# Patient Record
Sex: Female | Born: 1945 | Race: White | Hispanic: No | Marital: Married | Smoking: Never smoker
Health system: Southern US, Community
[De-identification: ages and names within clinical notes are randomized; demographics above are authoritative.]

## PROBLEM LIST (undated history)

## (undated) DIAGNOSIS — R32 Unspecified urinary incontinence: Secondary | ICD-10-CM

## (undated) DIAGNOSIS — M1990 Unspecified osteoarthritis, unspecified site: Secondary | ICD-10-CM

## (undated) DIAGNOSIS — K219 Gastro-esophageal reflux disease without esophagitis: Secondary | ICD-10-CM

## (undated) DIAGNOSIS — E7800 Pure hypercholesterolemia, unspecified: Secondary | ICD-10-CM

## (undated) DIAGNOSIS — M810 Age-related osteoporosis without current pathological fracture: Secondary | ICD-10-CM

## (undated) HISTORY — PX: CYST EXCISION: SHX5701

## (undated) HISTORY — PX: ABDOMINAL HYSTERECTOMY: SHX81

## (undated) HISTORY — PX: HYSTEROSCOPY: SHX211

## (undated) HISTORY — DX: Age-related osteoporosis without current pathological fracture: M81.0

---

## 1997-07-16 ENCOUNTER — Other Ambulatory Visit: Admission: RE | Admit: 1997-07-16 | Discharge: 1997-07-16 | Payer: Self-pay | Admitting: *Deleted

## 1998-01-07 ENCOUNTER — Ambulatory Visit (HOSPITAL_COMMUNITY): Admission: RE | Admit: 1998-01-07 | Discharge: 1998-01-07 | Payer: Self-pay | Admitting: Obstetrics & Gynecology

## 1998-01-07 ENCOUNTER — Encounter: Payer: Self-pay | Admitting: Obstetrics & Gynecology

## 1999-02-16 ENCOUNTER — Other Ambulatory Visit: Admission: RE | Admit: 1999-02-16 | Discharge: 1999-02-16 | Payer: Self-pay | Admitting: Obstetrics & Gynecology

## 1999-09-15 ENCOUNTER — Ambulatory Visit (HOSPITAL_COMMUNITY): Admission: RE | Admit: 1999-09-15 | Discharge: 1999-09-15 | Payer: Self-pay | Admitting: Obstetrics & Gynecology

## 1999-09-15 ENCOUNTER — Encounter: Payer: Self-pay | Admitting: Obstetrics & Gynecology

## 1999-09-29 ENCOUNTER — Ambulatory Visit (HOSPITAL_COMMUNITY): Admission: RE | Admit: 1999-09-29 | Discharge: 1999-09-29 | Payer: Self-pay | Admitting: Obstetrics & Gynecology

## 2000-04-24 ENCOUNTER — Other Ambulatory Visit: Admission: RE | Admit: 2000-04-24 | Discharge: 2000-04-24 | Payer: Self-pay | Admitting: Obstetrics & Gynecology

## 2001-06-15 ENCOUNTER — Other Ambulatory Visit: Admission: RE | Admit: 2001-06-15 | Discharge: 2001-06-15 | Payer: Self-pay | Admitting: Obstetrics & Gynecology

## 2002-07-18 ENCOUNTER — Other Ambulatory Visit: Admission: RE | Admit: 2002-07-18 | Discharge: 2002-07-18 | Payer: Self-pay | Admitting: Obstetrics and Gynecology

## 2003-08-13 ENCOUNTER — Other Ambulatory Visit: Admission: RE | Admit: 2003-08-13 | Discharge: 2003-08-13 | Payer: Self-pay | Admitting: Obstetrics & Gynecology

## 2014-05-19 DIAGNOSIS — I10 Essential (primary) hypertension: Secondary | ICD-10-CM | POA: Diagnosis not present

## 2014-05-19 DIAGNOSIS — E780 Pure hypercholesterolemia: Secondary | ICD-10-CM | POA: Diagnosis not present

## 2014-05-20 DIAGNOSIS — E780 Pure hypercholesterolemia: Secondary | ICD-10-CM | POA: Diagnosis not present

## 2014-05-29 DIAGNOSIS — E785 Hyperlipidemia, unspecified: Secondary | ICD-10-CM | POA: Diagnosis not present

## 2014-05-29 DIAGNOSIS — M545 Low back pain: Secondary | ICD-10-CM | POA: Diagnosis not present

## 2014-05-29 DIAGNOSIS — D369 Benign neoplasm, unspecified site: Secondary | ICD-10-CM | POA: Diagnosis not present

## 2014-05-29 DIAGNOSIS — M722 Plantar fascial fibromatosis: Secondary | ICD-10-CM | POA: Diagnosis not present

## 2014-06-20 DIAGNOSIS — D2372 Other benign neoplasm of skin of left lower limb, including hip: Secondary | ICD-10-CM | POA: Diagnosis not present

## 2014-06-20 DIAGNOSIS — R898 Other abnormal findings in specimens from other organs, systems and tissues: Secondary | ICD-10-CM | POA: Diagnosis not present

## 2014-06-20 DIAGNOSIS — D489 Neoplasm of uncertain behavior, unspecified: Secondary | ICD-10-CM | POA: Diagnosis not present

## 2014-06-20 DIAGNOSIS — L988 Other specified disorders of the skin and subcutaneous tissue: Secondary | ICD-10-CM | POA: Diagnosis not present

## 2014-06-25 DIAGNOSIS — L821 Other seborrheic keratosis: Secondary | ICD-10-CM | POA: Diagnosis not present

## 2014-07-25 DIAGNOSIS — R3915 Urgency of urination: Secondary | ICD-10-CM | POA: Diagnosis not present

## 2014-07-25 DIAGNOSIS — N905 Atrophy of vulva: Secondary | ICD-10-CM | POA: Diagnosis not present

## 2014-09-15 DIAGNOSIS — H2513 Age-related nuclear cataract, bilateral: Secondary | ICD-10-CM | POA: Diagnosis not present

## 2014-09-15 DIAGNOSIS — H40033 Anatomical narrow angle, bilateral: Secondary | ICD-10-CM | POA: Diagnosis not present

## 2014-09-29 DIAGNOSIS — I10 Essential (primary) hypertension: Secondary | ICD-10-CM | POA: Diagnosis not present

## 2014-09-29 DIAGNOSIS — E785 Hyperlipidemia, unspecified: Secondary | ICD-10-CM | POA: Diagnosis not present

## 2014-09-29 DIAGNOSIS — E780 Pure hypercholesterolemia: Secondary | ICD-10-CM | POA: Diagnosis not present

## 2014-10-06 DIAGNOSIS — E785 Hyperlipidemia, unspecified: Secondary | ICD-10-CM | POA: Diagnosis not present

## 2014-10-06 DIAGNOSIS — M545 Low back pain: Secondary | ICD-10-CM | POA: Diagnosis not present

## 2014-10-06 DIAGNOSIS — M722 Plantar fascial fibromatosis: Secondary | ICD-10-CM | POA: Diagnosis not present

## 2015-03-04 DIAGNOSIS — E785 Hyperlipidemia, unspecified: Secondary | ICD-10-CM | POA: Diagnosis not present

## 2015-03-04 DIAGNOSIS — E7800 Pure hypercholesterolemia, unspecified: Secondary | ICD-10-CM | POA: Diagnosis not present

## 2015-03-04 DIAGNOSIS — I10 Essential (primary) hypertension: Secondary | ICD-10-CM | POA: Diagnosis not present

## 2015-03-11 DIAGNOSIS — Z1389 Encounter for screening for other disorder: Secondary | ICD-10-CM | POA: Diagnosis not present

## 2015-03-11 DIAGNOSIS — M545 Low back pain: Secondary | ICD-10-CM | POA: Diagnosis not present

## 2015-03-11 DIAGNOSIS — E785 Hyperlipidemia, unspecified: Secondary | ICD-10-CM | POA: Diagnosis not present

## 2015-08-04 DIAGNOSIS — E7800 Pure hypercholesterolemia, unspecified: Secondary | ICD-10-CM | POA: Diagnosis not present

## 2015-08-04 DIAGNOSIS — Z13220 Encounter for screening for lipoid disorders: Secondary | ICD-10-CM | POA: Diagnosis not present

## 2015-08-04 DIAGNOSIS — I10 Essential (primary) hypertension: Secondary | ICD-10-CM | POA: Diagnosis not present

## 2015-08-07 DIAGNOSIS — E785 Hyperlipidemia, unspecified: Secondary | ICD-10-CM | POA: Diagnosis not present

## 2015-08-07 DIAGNOSIS — M545 Low back pain: Secondary | ICD-10-CM | POA: Diagnosis not present

## 2015-08-07 DIAGNOSIS — K5900 Constipation, unspecified: Secondary | ICD-10-CM | POA: Diagnosis not present

## 2015-08-13 DIAGNOSIS — Z1231 Encounter for screening mammogram for malignant neoplasm of breast: Secondary | ICD-10-CM | POA: Diagnosis not present

## 2015-08-14 DIAGNOSIS — N10 Acute tubulo-interstitial nephritis: Secondary | ICD-10-CM | POA: Diagnosis not present

## 2015-08-14 DIAGNOSIS — R509 Fever, unspecified: Secondary | ICD-10-CM | POA: Diagnosis not present

## 2015-08-14 DIAGNOSIS — R300 Dysuria: Secondary | ICD-10-CM | POA: Diagnosis not present

## 2015-09-14 DIAGNOSIS — G44219 Episodic tension-type headache, not intractable: Secondary | ICD-10-CM | POA: Diagnosis not present

## 2015-12-02 DIAGNOSIS — N3000 Acute cystitis without hematuria: Secondary | ICD-10-CM | POA: Diagnosis not present

## 2015-12-02 DIAGNOSIS — Z23 Encounter for immunization: Secondary | ICD-10-CM | POA: Diagnosis not present

## 2015-12-02 DIAGNOSIS — R300 Dysuria: Secondary | ICD-10-CM | POA: Diagnosis not present

## 2015-12-13 ENCOUNTER — Emergency Department (HOSPITAL_COMMUNITY): Payer: Medicare Other

## 2015-12-13 ENCOUNTER — Inpatient Hospital Stay (HOSPITAL_COMMUNITY)
Admission: EM | Admit: 2015-12-13 | Discharge: 2015-12-16 | DRG: 470 | Disposition: A | Discharge status: Home Health | Payer: Medicare Other | Attending: Internal Medicine | Admitting: Internal Medicine

## 2015-12-13 ENCOUNTER — Encounter (HOSPITAL_COMMUNITY): Payer: Self-pay | Admitting: Emergency Medicine

## 2015-12-13 DIAGNOSIS — S72002A Fracture of unspecified part of neck of left femur, initial encounter for closed fracture: Secondary | ICD-10-CM | POA: Diagnosis not present

## 2015-12-13 DIAGNOSIS — R52 Pain, unspecified: Secondary | ICD-10-CM | POA: Diagnosis not present

## 2015-12-13 DIAGNOSIS — D500 Iron deficiency anemia secondary to blood loss (chronic): Secondary | ICD-10-CM | POA: Diagnosis present

## 2015-12-13 DIAGNOSIS — Z808 Family history of malignant neoplasm of other organs or systems: Secondary | ICD-10-CM | POA: Diagnosis not present

## 2015-12-13 DIAGNOSIS — W19XXXA Unspecified fall, initial encounter: Secondary | ICD-10-CM | POA: Diagnosis not present

## 2015-12-13 DIAGNOSIS — W109XXA Fall (on) (from) unspecified stairs and steps, initial encounter: Secondary | ICD-10-CM | POA: Diagnosis present

## 2015-12-13 DIAGNOSIS — M25552 Pain in left hip: Secondary | ICD-10-CM | POA: Diagnosis not present

## 2015-12-13 DIAGNOSIS — D700 Congenital agranulocytosis: Secondary | ICD-10-CM | POA: Diagnosis not present

## 2015-12-13 DIAGNOSIS — Z801 Family history of malignant neoplasm of trachea, bronchus and lung: Secondary | ICD-10-CM | POA: Diagnosis not present

## 2015-12-13 DIAGNOSIS — R2681 Unsteadiness on feet: Secondary | ICD-10-CM | POA: Diagnosis not present

## 2015-12-13 DIAGNOSIS — S72012A Unspecified intracapsular fracture of left femur, initial encounter for closed fracture: Principal | ICD-10-CM | POA: Diagnosis present

## 2015-12-13 DIAGNOSIS — Z7982 Long term (current) use of aspirin: Secondary | ICD-10-CM | POA: Diagnosis not present

## 2015-12-13 DIAGNOSIS — D72829 Elevated white blood cell count, unspecified: Secondary | ICD-10-CM | POA: Diagnosis present

## 2015-12-13 DIAGNOSIS — E7800 Pure hypercholesterolemia, unspecified: Secondary | ICD-10-CM | POA: Diagnosis present

## 2015-12-13 DIAGNOSIS — M79605 Pain in left leg: Secondary | ICD-10-CM | POA: Diagnosis present

## 2015-12-13 DIAGNOSIS — S299XXA Unspecified injury of thorax, initial encounter: Secondary | ICD-10-CM | POA: Diagnosis not present

## 2015-12-13 DIAGNOSIS — S72092A Other fracture of head and neck of left femur, initial encounter for closed fracture: Secondary | ICD-10-CM | POA: Diagnosis not present

## 2015-12-13 DIAGNOSIS — Y9289 Other specified places as the place of occurrence of the external cause: Secondary | ICD-10-CM | POA: Diagnosis not present

## 2015-12-13 DIAGNOSIS — E785 Hyperlipidemia, unspecified: Secondary | ICD-10-CM | POA: Diagnosis present

## 2015-12-13 DIAGNOSIS — Z825 Family history of asthma and other chronic lower respiratory diseases: Secondary | ICD-10-CM | POA: Diagnosis not present

## 2015-12-13 DIAGNOSIS — D72819 Decreased white blood cell count, unspecified: Secondary | ICD-10-CM | POA: Diagnosis not present

## 2015-12-13 HISTORY — DX: Pure hypercholesterolemia, unspecified: E78.00

## 2015-12-13 LAB — BASIC METABOLIC PANEL
Anion gap: 10 (ref 5–15)
BUN: 14 mg/dL (ref 6–20)
CHLORIDE: 103 mmol/L (ref 101–111)
CO2: 23 mmol/L (ref 22–32)
Calcium: 9.4 mg/dL (ref 8.9–10.3)
Creatinine, Ser: 0.73 mg/dL (ref 0.44–1.00)
GFR calc non Af Amer: >60 mL/min (ref >60)
Glucose, Bld: 108 mg/dL — ABNORMAL HIGH (ref 65–99)
POTASSIUM: 3.8 mmol/L (ref 3.5–5.1)
SODIUM: 136 mmol/L (ref 135–145)

## 2015-12-13 LAB — CBC WITH DIFFERENTIAL/PLATELET
Basophils Absolute: 0 10*3/uL (ref 0.0–0.1)
Basophils Relative: 0 %
EOS ABS: 0 10*3/uL (ref 0.0–0.7)
Eosinophils Relative: 0 %
HCT: 39.2 % (ref 36.0–46.0)
HEMOGLOBIN: 12.7 g/dL (ref 12.0–15.0)
LYMPHS ABS: 1.4 10*3/uL (ref 0.7–4.0)
Lymphocytes Relative: 8 %
MCH: 29.3 pg (ref 26.0–34.0)
MCHC: 32.4 g/dL (ref 30.0–36.0)
MCV: 90.5 fL (ref 78.0–100.0)
Monocytes Absolute: 0.8 10*3/uL (ref 0.1–1.0)
Monocytes Relative: 5 %
NEUTROS ABS: 14.9 10*3/uL — AB (ref 1.7–7.7)
NEUTROS PCT: 87 %
Platelets: 268 10*3/uL (ref 150–400)
RBC: 4.33 MIL/uL (ref 3.87–5.11)
RDW: 13.2 % (ref 11.5–15.5)
WBC: 17.1 10*3/uL — AB (ref 4.0–10.5)

## 2015-12-13 LAB — PROTIME-INR
INR: 1.02
PROTHROMBIN TIME: 13.4 s (ref 11.4–15.2)

## 2015-12-13 MED ORDER — ALPRAZOLAM 0.25 MG PO TABS
0.2500 mg | ORAL_TABLET | Freq: Two times a day (BID) | ORAL | Status: DC | PRN
  Administered 2015-12-14 – 2015-12-16 (×2): 0.25 mg via ORAL
  Filled 2015-12-13 (×2): qty 1

## 2015-12-13 MED ORDER — METHOCARBAMOL 1000 MG/10ML IJ SOLN
500.0000 mg | Freq: Three times a day (TID) | INTRAVENOUS | Status: DC | PRN
  Administered 2015-12-14: 500 mg via INTRAVENOUS
  Filled 2015-12-13 (×4): qty 5

## 2015-12-13 MED ORDER — HYDROMORPHONE HCL 1 MG/ML IJ SOLN: 1.0000 mg | INTRAMUSCULAR | Status: DC | PRN

## 2015-12-13 MED ORDER — CO Q 10 100 MG PO CAPS: 100.0000 mg | ORAL_CAPSULE | Freq: Every day | ORAL | Status: DC

## 2015-12-13 MED ORDER — SODIUM CHLORIDE 0.9 % IV SOLN: INTRAVENOUS | Status: DC

## 2015-12-13 MED ORDER — HYDROMORPHONE HCL 1 MG/ML IJ SOLN: 1.0000 mg | Freq: Once | INTRAMUSCULAR | Status: DC

## 2015-12-13 MED ORDER — ASPIRIN EC 325 MG PO TBEC: 325.0000 mg | DELAYED_RELEASE_TABLET | Freq: Every day | ORAL | Status: DC | PRN

## 2015-12-13 MED ORDER — MORPHINE SULFATE (PF) 2 MG/ML IV SOLN
2.0000 mg | INTRAVENOUS | Status: DC | PRN
  Administered 2015-12-13 – 2015-12-14 (×4): 2 mg via INTRAVENOUS
  Filled 2015-12-13 (×4): qty 1

## 2015-12-13 MED ORDER — HYDROMORPHONE HCL 1 MG/ML IJ SOLN
0.5000 mg | INTRAMUSCULAR | Status: DC | PRN
Start: 2015-12-13 — End: 2015-12-13

## 2015-12-13 MED ORDER — ADULT MULTIVITAMIN W/MINERALS CH
1.0000 | ORAL_TABLET | Freq: Every day | ORAL | Status: DC
  Administered 2015-12-15 – 2015-12-16 (×2): 1 via ORAL
  Filled 2015-12-13 (×2): qty 1

## 2015-12-13 MED ORDER — RED YEAST RICE 600 MG PO CAPS: 1200.0000 mg | ORAL_CAPSULE | Freq: Every day | ORAL | Status: DC

## 2015-12-13 MED ORDER — SENNOSIDES-DOCUSATE SODIUM 8.6-50 MG PO TABS
1.0000 | ORAL_TABLET | Freq: Every evening | ORAL | Status: DC | PRN
  Filled 2015-12-13: qty 1

## 2015-12-13 MED ORDER — ONDANSETRON HCL 4 MG/2ML IJ SOLN
4.0000 mg | Freq: Three times a day (TID) | INTRAMUSCULAR | Status: DC | PRN
Start: 2015-12-13 — End: 2015-12-14

## 2015-12-13 MED ORDER — HYDROMORPHONE HCL 1 MG/ML IJ SOLN
1.0000 mg | Freq: Once | INTRAMUSCULAR | Status: AC
  Administered 2015-12-13: 1 mg via INTRAVENOUS
  Filled 2015-12-13: qty 1

## 2015-12-13 MED ORDER — SODIUM CHLORIDE 0.9 % IV SOLN
INTRAVENOUS | Status: DC
  Administered 2015-12-13 – 2015-12-14 (×2): via INTRAVENOUS
  Administered 2015-12-15: 75 mL/h via INTRAVENOUS
  Administered 2015-12-15: 75 mL via INTRAVENOUS

## 2015-12-13 MED ORDER — HEPARIN SODIUM (PORCINE) 5000 UNIT/ML IJ SOLN
5000.0000 [IU] | Freq: Once | INTRAMUSCULAR | Status: AC
  Administered 2015-12-14: 5000 [IU] via SUBCUTANEOUS
  Filled 2015-12-13: qty 1

## 2015-12-13 MED ORDER — OXYCODONE-ACETAMINOPHEN 5-325 MG PO TABS
1.0000 | ORAL_TABLET | ORAL | Status: DC | PRN
  Administered 2015-12-14 (×3): 1 via ORAL
  Filled 2015-12-13 (×3): qty 1

## 2015-12-13 MED ORDER — LORAZEPAM 2 MG/ML IJ SOLN
1.0000 mg | Freq: Once | INTRAMUSCULAR | Status: AC
  Administered 2015-12-13: 1 mg via INTRAVENOUS
  Filled 2015-12-13: qty 1

## 2015-12-13 MED ORDER — ZOLPIDEM TARTRATE 5 MG PO TABS
5.0000 mg | ORAL_TABLET | Freq: Every evening | ORAL | Status: DC | PRN
Start: 2015-12-13 — End: 2015-12-16

## 2015-12-13 NOTE — H&P (Addendum)
History and Physical    MARYLOUISE DUHR [id number] DOB: 1945-11-06 DOA: 12/13/2015  Referring MD/NP/PA:   PCP: [place]   Patient coming from:  The patient is coming from home.  At baseline, pt is independent for most of ADL.  Chief Complaint: left hip pain after fall  HPI: Julia Blair is a 70 y.o. female with medical history significant of hyperlipidemia, who presents with left hip pain after fall.  Pt states that she tripped her steps on the wet cement, fell and injured her left hip, causing severe pain at about 6:00 PM. She strongly denies any injury to head and neck. No headache or neck pain. Her left hip pain is constant, sharp, 10 out of 10 in severity, radiating to the lateral thigh, aggravated by movement. No leg numbness. Patient did not have prodromal symptoms, such as no dizziness, unilateral weakness, chest pain, palpitation. She did not have LOC. Patient denies fever, chills, nausea, vomiting, abdominal pain, diarrhea, symptoms of UTI.  ED Course: pt was found to have WBC 17.1, INR 1.02, electrolytes renal function okay, temperature normal, no tachycardia, oxygen saturation normal, negative chest x-ray. X-ray of left hip/pelvis showed subcapital fracture through the left femoral neck, with superior displacement of the distal femur. Pt is admitted to [facility] bed as inpatient. Orthopedic surgeon, Dr. Sharol Given was consulted.  Review of Systems:   General: no fevers, chills, no changes in body weight, has fatigue HEENT: no blurry vision, hearing changes or sore throat Respiratory: no dyspnea, coughing, wheezing CV: no chest pain, no palpitations GI: no nausea, vomiting, abdominal pain, diarrhea, constipation GU: no dysuria, burning on urination, increased urinary frequency, hematuria  Ext: no leg edema Neuro: no unilateral weakness, numbness, or tingling, no vision change or hearing loss Skin: no rash, no skin tear. MSK: has left hip pain Heme: No easy  bruising.  Travel history: No recent long distant travel.  Allergy:  Allergies  Allergen Reactions  . Naproxen     Headache    Past Medical History:  Diagnosis Date  . High cholesterol     Past Surgical History:  Procedure Laterality Date  . ABDOMINAL HYSTERECTOMY      Social History:  reports that she has never smoked. She does not have any smokeless tobacco history on file. She reports that she drinks alcohol. She reports that she does not use drugs.  Family History:  Family History  Problem Relation Age of Onset  . Asthma Mother   . Throat cancer Father   . Lung cancer Brother      Prior to Admission medications   Medication Sig Start Date End Date Taking? Authorizing Provider  aspirin 325 MG EC tablet Take 325 mg by mouth daily as needed for pain.   Yes Historical Provider, MD  Coenzyme Q10 (CO Q 10) 100 MG CAPS Take 100 mg by mouth daily.   Yes Historical Provider, MD  Multiple Vitamin (MULTIVITAMIN) tablet Take 1 tablet by mouth daily.   Yes Historical Provider, MD  Red Yeast Rice 600 MG CAPS Take 1,200 mg by mouth daily.   Yes Historical Provider, MD    Physical Exam: Vitals:   12/13/15 1951 12/13/15 1953  BP: 164/99   Pulse: 83   Resp: 23   Temp: 97.6 F (36.4 C)   TempSrc: Oral   SpO2: 99%   Weight:  68 kg (150 lb)  Height:  5\' 3"  (1.6 m)   General: Not in acute distress HEENT:  Eyes: PERRL, EOMI, no scleral icterus.       ENT: No discharge from the ears and nose, no pharynx injection, no tonsillar enlargement.        Neck: No JVD, no bruit, no mass felt. Heme: No neck lymph node enlargement. Cardiac: S1/S2, RRR, No murmurs, No gallops or rubs. Respiratory: No rales, wheezing, rhonchi or rubs. GI: Soft, nondistended, nontender, no rebound pain, no organomegaly, BS present. GU: No hematuria Ext: No pitting leg edema bilaterally. 2+DP/PT pulse bilaterally. Musculoskeletal: No joint deformities, No joint redness or warmth. Has tenderness over  left hip. Skin: No rashes.  Neuro: Alert, oriented X3, cranial nerves II-XII grossly intact, moves all extremities. Psych: Patient is not psychotic, no suicidal or hemocidal ideation.  Labs on Admission: I have personally reviewed following labs and imaging studies  CBC:  Recent Labs Lab 12/13/15 2129  WBC 17.1*  NEUTROABS 14.9*  HGB 12.7  HCT 39.2  MCV 90.5  PLT [id number]   Basic Metabolic Panel:  Recent Labs Lab 12/13/15 2129  NA 136  K 3.8  CL 103  CO2 23  GLUCOSE 108*  BUN 14  CREATININE 0.73  CALCIUM 9.4   GFR: Estimated Creatinine Clearance: 61.4 mL/min (by C-G formula based on SCr of 0.73 mg/dL). Liver Function Tests: No results for input(s): AST, ALT, ALKPHOS, BILITOT, PROT, ALBUMIN in the last 168 hours. No results for input(s): LIPASE, AMYLASE in the last 168 hours. No results for input(s): AMMONIA in the last 168 hours. Coagulation Profile:  Recent Labs Lab 12/13/15 2129  INR 1.02   Cardiac Enzymes: No results for input(s): CKTOTAL, CKMB, CKMBINDEX, TROPONINI in the last 168 hours. BNP (last 3 results) No results for input(s): PROBNP in the last 8760 hours. HbA1C: No results for input(s): HGBA1C in the last 72 hours. CBG: No results for input(s): GLUCAP in the last 168 hours. Lipid Profile: No results for input(s): CHOL, HDL, LDLCALC, TRIG, CHOLHDL, LDLDIRECT in the last 72 hours. Thyroid Function Tests: No results for input(s): TSH, T4TOTAL, FREET4, T3FREE, THYROIDAB in the last 72 hours. Anemia Panel: No results for input(s): VITAMINB12, FOLATE, FERRITIN, TIBC, IRON, RETICCTPCT in the last 72 hours. Urine analysis: No results found for: COLORURINE, APPEARANCEUR, LABSPEC, PHURINE, GLUCOSEU, HGBUR, BILIRUBINUR, KETONESUR, PROTEINUR, UROBILINOGEN, NITRITE, LEUKOCYTESUR Sepsis Labs: @LABRCNTIP (procalcitonin:4,lacticidven:4) )No results found for this or any previous visit (from the past 240 hour(s)).   Radiological Exams on Admission: Dg Chest 1  View  Result Date: 12/13/2015 CLINICAL DATA:  Status post fall, with concern for chest injury. Initial encounter. EXAM: CHEST 1 VIEW COMPARISON:  None. FINDINGS: The lungs are well-aerated and clear. There is no evidence of focal opacification, pleural effusion or pneumothorax. The cardiomediastinal silhouette is mildly enlarged. No acute osseous abnormalities are seen. IMPRESSION: Mild cardiomegaly. Lungs remain grossly clear. No displaced rib fracture seen. Electronically Signed   By: Garald Balding M.D.   On: 12/13/2015 21:27   Dg Hip Unilat W Or Wo Pelvis 2-3 Views Left  Result Date: 12/13/2015 CLINICAL DATA:  Slipped and fell, with left hip pain. Initial encounter. EXAM: DG HIP (WITH OR WITHOUT PELVIS) 2-3V LEFT COMPARISON:  None. FINDINGS: There is a subcapital fracture through the left femoral neck, with superior displacement of the distal femur. The left femoral head remains seated at the acetabulum. The right hip joint is unremarkable in appearance. No significant degenerative change is appreciated. The sacroiliac joints are unremarkable in appearance. The visualized bowel gas pattern is grossly unremarkable in appearance. IMPRESSION: Subcapital fracture through  the left femoral neck, with superior displacement of the distal femur. Electronically Signed   By: Garald Balding M.D.   On: 12/13/2015 21:26     EKG: Not done in ED, will get one.   Assessment/Plan Principal Problem:   Closed left hip fracture, initial encounter ([place]) Active Problems:   HLD (hyperlipidemia)   Fall   Leukocytopenia   Closed left hip fracture ([place]): As evidenced by x-ray. Patient has severe pain now. No neurovascular compromise. Orthopedic surgeon was consulted. Dr. Sharol Given is planning surgery possibly tomorrow afternoon.  - will admit to Med-surg bed - Pain control: morphine prn and percocet - When necessary Zofran for nausea - Robaxin for muscle spasm - type and cross - INR/PTT - DVT PPx: give one dose of  sq heparin tonight plus SCD  Leukocytosis: Likely due to stress-induced demargination. CXR negative. Patient does not have signs of infection. -Follow-up CBC - check UA  HLD: Last LDL was not on record. Patient is not on statin. -Check FLP   DVT ppx: SCD (will give one dose of Sq heparin tonight) Code Status: Full code Family Communication: Yes, patient's sister at bed side Disposition Plan:  Anticipate discharge back to previous home environment Consults called:  Kizzie Ide Admission status: medical floor/obs   Date of Service 12/13/2015    Ivor Costa [facility] Pager [phone]  If 7PM-7AM, please contact night-coverage [email or web address] Password [email or web address] 12/13/2015, 10:28 PM

## 2015-12-13 NOTE — ED Provider Notes (Signed)
[facility] DEPT Provider Note   CSN: [id number] Arrival date & time: 12/13/15  1949     History   Chief Complaint Chief Complaint  Patient presents with  . Hip Injury    HPI Julia Blair is a 70 y.o. female.  HPI Patient presents to the emergency department after a mechanical fall which point she slipped on wet cement.  She states that she landed on her left side.  She presents for severe pain in her left hip.  She denies head injury.  She denies neck pain.  She reports the pain in her left hip is severe in severity.  She denies chest pain or shortness of breath.  Denies abdominal pain.  Reports no numbness or tingling of her left lower extremity.   Past Medical History:  Diagnosis Date  . High cholesterol     There are no active problems to display for this patient.   Past Surgical History:  Procedure Laterality Date  . ABDOMINAL HYSTERECTOMY      OB History    No data available       Home Medications    Prior to Admission medications   Medication Sig Start Date End Date Taking? Authorizing Provider  aspirin 325 MG EC tablet Take 325 mg by mouth daily as needed for pain.   Yes Historical Provider, MD  Coenzyme Q10 (CO Q 10) 100 MG CAPS Take 100 mg by mouth daily.   Yes Historical Provider, MD  Multiple Vitamin (MULTIVITAMIN) tablet Take 1 tablet by mouth daily.   Yes Historical Provider, MD  Red Yeast Rice 600 MG CAPS Take 1,200 mg by mouth daily.   Yes Historical Provider, MD    Family History No family history on file.  Social History Social History  Substance Use Topics  . Smoking status: Not on file  . Smokeless tobacco: Not on file  . Alcohol use Not on file     Allergies   Naproxen   Review of Systems Review of Systems  All other systems reviewed and are negative.    Physical Exam Updated Vital Signs BP 164/99 (BP Location: Right Arm)   Pulse 83   Temp 97.6 F (36.4 C) (Oral)   Resp 23   Ht 5\' 3"  (1.6 m)   Wt 150 lb (68  kg)   SpO2 99%   BMI 26.57 kg/m   Physical Exam  Constitutional: She is oriented to person, place, and time. She appears well-developed and well-nourished. No distress.  Uncomfortable appearing  HENT:  Head: Normocephalic and atraumatic.  Eyes: EOM are normal.  Neck: Normal range of motion.  Cardiovascular: Normal rate and regular rhythm.   Pulmonary/Chest: Effort normal and breath sounds normal.  Abdominal: Soft. She exhibits no distension. There is no tenderness.  Musculoskeletal:  No shortening or external rotation of the left lower extremity but there is pain with range of motion of the left hip  Neurological: She is alert and oriented to person, place, and time.  Skin: Skin is warm and dry.  Psychiatric: She has a normal mood and affect. Judgment normal.  Nursing note and vitals reviewed.    ED Treatments / Results  Labs (all labs ordered are listed, but only abnormal results are displayed) Labs Reviewed  CBC WITH DIFFERENTIAL/PLATELET - Abnormal; Notable for the following:       Result Value   WBC 17.1 (*)    Neutro Abs 14.9 (*)    All other components within normal limits  BASIC  METABOLIC PANEL  PROTIME-INR    EKG  EKG Interpretation None       Radiology Dg Chest 1 View  Result Date: 12/13/2015 CLINICAL DATA:  Status post fall, with concern for chest injury. Initial encounter. EXAM: CHEST 1 VIEW COMPARISON:  None. FINDINGS: The lungs are well-aerated and clear. There is no evidence of focal opacification, pleural effusion or pneumothorax. The cardiomediastinal silhouette is mildly enlarged. No acute osseous abnormalities are seen. IMPRESSION: Mild cardiomegaly. Lungs remain grossly clear. No displaced rib fracture seen. Electronically Signed   By: Garald Balding M.D.   On: 12/13/2015 21:27   Dg Hip Unilat W Or Wo Pelvis 2-3 Views Left  Result Date: 12/13/2015 CLINICAL DATA:  Slipped and fell, with left hip pain. Initial encounter. EXAM: DG HIP (WITH OR  WITHOUT PELVIS) 2-3V LEFT COMPARISON:  None. FINDINGS: There is a subcapital fracture through the left femoral neck, with superior displacement of the distal femur. The left femoral head remains seated at the acetabulum. The right hip joint is unremarkable in appearance. No significant degenerative change is appreciated. The sacroiliac joints are unremarkable in appearance. The visualized bowel gas pattern is grossly unremarkable in appearance. IMPRESSION: Subcapital fracture through the left femoral neck, with superior displacement of the distal femur. Electronically Signed   By: Garald Balding M.D.   On: 12/13/2015 21:26    Procedures Procedures (including critical care time)  Medications Ordered in ED Medications  HYDROmorphone (DILAUDID) injection 0.5 mg (not administered)  HYDROmorphone (DILAUDID) injection 1 mg (1 mg Intravenous Given 12/13/15 2028)     Initial Impression / Assessment and Plan / ED Course  I have reviewed the triage vital signs and the nursing notes.  Pertinent labs & imaging results that were available during my care of the patient were reviewed by me and considered in my medical decision making (see chart for details).  Clinical Course    Left subcapital hip fracture.  Spoke with orthopedic surgeon Dr. Sharol Given who will see the patient in the morning.  Request patient state nothing by mouth after midnight.  Patient will be admitted to the hospitalist service.  No other injury.  Pain controlled  Ortho: Dr Sharol Given  Final Clinical Impressions(s) / ED Diagnoses   Final diagnoses:  Closed left hip fracture, initial encounter [facility])    New Prescriptions New Prescriptions   No medications on file     Jola Schmidt, MD 12/13/15 2147

## 2015-12-13 NOTE — Progress Notes (Signed)
PHARMACIST - PHYSICIAN ORDER COMMUNICATION  CONCERNING: P&T Medication Policy on Herbal Medications  DESCRIPTION:  This patient's order for:  Red yeast rice and CoQ 10  has been noted.  This product(s) is classified as an "herbal" or natural product. Due to a lack of definitive safety studies or FDA approval, nonstandard manufacturing practices, plus the potential risk of unknown drug-drug interactions while on inpatient medications, the Pharmacy and Therapeutics Committee does not permit the use of "herbal" or natural products of this type within [facility].   ACTION TAKEN: The pharmacy department is unable to verify this order at this time and your patient has been informed of this safety policy. Please reevaluate patient's clinical condition at discharge and address if the herbal or natural product(s) should be resumed at that time.  Sherlon Handing, PharmD, BCPS Clinical pharmacist, pager [phone] 12/13/2015 10:18 PM

## 2015-12-13 NOTE — ED Notes (Signed)
Delay in lab dray pt is in [place] and when return,  Nurse will draw labs.

## 2015-12-13 NOTE — ED Notes (Signed)
Pt family at nurses station asking for EDP, explained that he was in room with another pt and he would be in shortly.

## 2015-12-13 NOTE — ED Notes (Signed)
Pt called out stating she needs to use bathroom, pt refusing to use bedpan and states she would rather "hold her urine until she dies"

## 2015-12-13 NOTE — ED Triage Notes (Signed)
Pt in EMS from home, slipped and fell on concrete resulting in L hip injury, possible dislocation. Given 10mg  Morphine en route. PVA intact. A/OX4, VSS

## 2015-12-14 ENCOUNTER — Inpatient Hospital Stay (HOSPITAL_COMMUNITY): Payer: Medicare Other | Admitting: Anesthesiology

## 2015-12-14 ENCOUNTER — Encounter (HOSPITAL_COMMUNITY): Payer: Self-pay | Admitting: Anesthesiology

## 2015-12-14 ENCOUNTER — Encounter (HOSPITAL_COMMUNITY)
Admission: EM | Disposition: A | Discharge status: Home Health | Payer: Self-pay | Source: Home / Self Care | Attending: Family Medicine

## 2015-12-14 DIAGNOSIS — S72012A Unspecified intracapsular fracture of left femur, initial encounter for closed fracture: Secondary | ICD-10-CM

## 2015-12-14 HISTORY — PX: HIP ARTHROPLASTY: SHX981

## 2015-12-14 LAB — TYPE AND SCREEN
ABO/RH(D): B POS
ANTIBODY SCREEN: NEGATIVE

## 2015-12-14 LAB — LIPID PANEL
CHOLESTEROL: 278 mg/dL — AB (ref 0–200)
HDL: 38 mg/dL — AB (ref >40)
LDL Cholesterol: 181 mg/dL — ABNORMAL HIGH (ref 0–99)
TRIGLYCERIDES: 295 mg/dL — AB (ref <150)
Total CHOL/HDL Ratio: 7.3 RATIO
VLDL: 59 mg/dL — ABNORMAL HIGH (ref 0–40)

## 2015-12-14 LAB — ABO/RH: ABO/RH(D): B POS

## 2015-12-14 LAB — URINALYSIS, ROUTINE W REFLEX MICROSCOPIC
Bilirubin Urine: NEGATIVE
GLUCOSE, UA: NEGATIVE mg/dL
Hgb urine dipstick: NEGATIVE
Ketones, ur: 15 mg/dL — AB
LEUKOCYTES UA: NEGATIVE
NITRITE: NEGATIVE
PROTEIN: NEGATIVE mg/dL
Specific Gravity, Urine: 1.015 (ref 1.005–1.030)
pH: 6 (ref 5.0–8.0)

## 2015-12-14 LAB — MRSA PCR SCREENING: MRSA by PCR: NEGATIVE

## 2015-12-14 SURGERY — HEMIARTHROPLASTY, HIP, DIRECT ANTERIOR APPROACH, FOR FRACTURE
Anesthesia: General | Site: Hip | Laterality: Left

## 2015-12-14 MED ORDER — FENTANYL CITRATE (PF) 100 MCG/2ML IJ SOLN
INTRAMUSCULAR | Status: AC
  Filled 2015-12-14: qty 2

## 2015-12-14 MED ORDER — MENTHOL 3 MG MT LOZG: 1.0000 | LOZENGE | OROMUCOSAL | Status: DC | PRN

## 2015-12-14 MED ORDER — ONDANSETRON HCL 4 MG/2ML IJ SOLN: 4.0000 mg | Freq: Once | INTRAMUSCULAR | Status: DC | PRN

## 2015-12-14 MED ORDER — METOCLOPRAMIDE HCL 5 MG PO TABS: 5.0000 mg | ORAL_TABLET | Freq: Three times a day (TID) | ORAL | Status: DC | PRN

## 2015-12-14 MED ORDER — ROCURONIUM BROMIDE 10 MG/ML (PF) SYRINGE
PREFILLED_SYRINGE | INTRAVENOUS | Status: AC
  Filled 2015-12-14: qty 10

## 2015-12-14 MED ORDER — ACETAMINOPHEN 650 MG RE SUPP: 650.0000 mg | Freq: Four times a day (QID) | RECTAL | Status: DC | PRN

## 2015-12-14 MED ORDER — MIDAZOLAM HCL 5 MG/5ML IJ SOLN
INTRAMUSCULAR | Status: DC | PRN
  Administered 2015-12-14: 2 mg via INTRAVENOUS

## 2015-12-14 MED ORDER — CHLORHEXIDINE GLUCONATE 4 % EX LIQD
60.0000 mL | Freq: Once | CUTANEOUS | Status: AC
  Administered 2015-12-14: 4 via TOPICAL
  Filled 2015-12-14: qty 60

## 2015-12-14 MED ORDER — DEXAMETHASONE SODIUM PHOSPHATE 10 MG/ML IJ SOLN
INTRAMUSCULAR | Status: AC
  Filled 2015-12-14: qty 1

## 2015-12-14 MED ORDER — MIDAZOLAM HCL 2 MG/2ML IJ SOLN
INTRAMUSCULAR | Status: AC
  Filled 2015-12-14: qty 2

## 2015-12-14 MED ORDER — METOCLOPRAMIDE HCL 5 MG/ML IJ SOLN: 5.0000 mg | Freq: Three times a day (TID) | INTRAMUSCULAR | Status: DC | PRN

## 2015-12-14 MED ORDER — SUGAMMADEX SODIUM 200 MG/2ML IV SOLN
INTRAVENOUS | Status: AC
  Filled 2015-12-14: qty 2

## 2015-12-14 MED ORDER — FENTANYL CITRATE (PF) 100 MCG/2ML IJ SOLN
INTRAMUSCULAR | Status: AC
  Filled 2015-12-14: qty 4

## 2015-12-14 MED ORDER — CEFAZOLIN SODIUM-DEXTROSE 2-4 GM/100ML-% IV SOLN
2.0000 g | INTRAVENOUS | Status: AC
  Administered 2015-12-14: 2 g via INTRAVENOUS
  Filled 2015-12-14 (×2): qty 100

## 2015-12-14 MED ORDER — ENSURE ENLIVE PO LIQD
237.0000 mL | Freq: Two times a day (BID) | ORAL | Status: DC
  Administered 2015-12-15 – 2015-12-16 (×4): 237 mL via ORAL

## 2015-12-14 MED ORDER — POVIDONE-IODINE 10 % EX SWAB
2.0000 "application " | Freq: Once | CUTANEOUS | Status: AC
  Administered 2015-12-14: 2 via TOPICAL

## 2015-12-14 MED ORDER — ONDANSETRON HCL 4 MG PO TABS: 4.0000 mg | ORAL_TABLET | Freq: Four times a day (QID) | ORAL | Status: DC | PRN

## 2015-12-14 MED ORDER — PHENOL 1.4 % MT LIQD: 1.0000 | OROMUCOSAL | Status: DC | PRN

## 2015-12-14 MED ORDER — LACTATED RINGERS IV SOLN
INTRAVENOUS | Status: DC
  Administered 2015-12-14 (×3): via INTRAVENOUS

## 2015-12-14 MED ORDER — HYDROMORPHONE HCL 1 MG/ML IJ SOLN
INTRAMUSCULAR | Status: AC
  Administered 2015-12-14: 0.5 mg via INTRAVENOUS
  Filled 2015-12-14: qty 1

## 2015-12-14 MED ORDER — ONDANSETRON HCL 4 MG/2ML IJ SOLN
INTRAMUSCULAR | Status: AC
  Filled 2015-12-14: qty 2

## 2015-12-14 MED ORDER — DEXAMETHASONE SODIUM PHOSPHATE 10 MG/ML IJ SOLN
INTRAMUSCULAR | Status: DC | PRN
  Administered 2015-12-14: 10 mg via INTRAVENOUS

## 2015-12-14 MED ORDER — ONDANSETRON HCL 4 MG/2ML IJ SOLN: 4.0000 mg | Freq: Four times a day (QID) | INTRAMUSCULAR | Status: DC | PRN

## 2015-12-14 MED ORDER — ASPIRIN EC 325 MG PO TBEC: 325.0000 mg | DELAYED_RELEASE_TABLET | Freq: Every day | ORAL | 0 refills | Status: DC

## 2015-12-14 MED ORDER — HYDROCODONE-ACETAMINOPHEN 5-325 MG PO TABS
1.0000 | ORAL_TABLET | Freq: Four times a day (QID) | ORAL | Status: DC | PRN
  Administered 2015-12-14 – 2015-12-16 (×7): 2 via ORAL
  Filled 2015-12-14 (×6): qty 2

## 2015-12-14 MED ORDER — ONDANSETRON HCL 4 MG/2ML IJ SOLN
INTRAMUSCULAR | Status: AC
  Filled 2015-12-14: qty 4

## 2015-12-14 MED ORDER — PROPOFOL 10 MG/ML IV BOLUS
INTRAVENOUS | Status: AC
  Filled 2015-12-14: qty 20

## 2015-12-14 MED ORDER — NEOSTIGMINE METHYLSULFATE 5 MG/5ML IV SOSY
PREFILLED_SYRINGE | INTRAVENOUS | Status: AC
  Filled 2015-12-14: qty 5

## 2015-12-14 MED ORDER — ONDANSETRON HCL 4 MG/2ML IJ SOLN
INTRAMUSCULAR | Status: DC | PRN
  Administered 2015-12-14: 4 mg via INTRAVENOUS

## 2015-12-14 MED ORDER — CEFAZOLIN SODIUM-DEXTROSE 2-4 GM/100ML-% IV SOLN
2.0000 g | Freq: Four times a day (QID) | INTRAVENOUS | Status: AC
  Administered 2015-12-15 (×2): 2 g via INTRAVENOUS
  Filled 2015-12-14 (×2): qty 100

## 2015-12-14 MED ORDER — HYDROMORPHONE HCL 1 MG/ML IJ SOLN
0.2500 mg | INTRAMUSCULAR | Status: DC | PRN
  Administered 2015-12-14 (×4): 0.5 mg via INTRAVENOUS

## 2015-12-14 MED ORDER — PROPOFOL 10 MG/ML IV BOLUS
INTRAVENOUS | Status: DC | PRN
  Administered 2015-12-14: 200 mg via INTRAVENOUS

## 2015-12-14 MED ORDER — ROCURONIUM BROMIDE 100 MG/10ML IV SOLN
INTRAVENOUS | Status: DC | PRN
  Administered 2015-12-14: 40 mg via INTRAVENOUS

## 2015-12-14 MED ORDER — HYDROCODONE-ACETAMINOPHEN 5-325 MG PO TABS
ORAL_TABLET | ORAL | Status: AC
  Administered 2015-12-15: 2 via ORAL
  Filled 2015-12-14: qty 2

## 2015-12-14 MED ORDER — ACETAMINOPHEN 500 MG PO TABS: 500.0000 mg | ORAL_TABLET | Freq: Four times a day (QID) | ORAL | 0 refills | Status: DC | PRN

## 2015-12-14 MED ORDER — MORPHINE SULFATE (PF) 2 MG/ML IV SOLN: 0.5000 mg | INTRAVENOUS | Status: DC | PRN

## 2015-12-14 MED ORDER — SODIUM CHLORIDE 0.9 % IR SOLN
Status: DC | PRN
  Administered 2015-12-14: 1000 mL

## 2015-12-14 MED ORDER — FENTANYL CITRATE (PF) 100 MCG/2ML IJ SOLN
INTRAMUSCULAR | Status: DC | PRN
  Administered 2015-12-14: 50 ug via INTRAVENOUS
  Administered 2015-12-14: 100 ug via INTRAVENOUS

## 2015-12-14 MED ORDER — LIDOCAINE HCL (CARDIAC) 20 MG/ML IV SOLN
INTRAVENOUS | Status: DC | PRN
  Administered 2015-12-14: 80 mg via INTRAVENOUS

## 2015-12-14 MED ORDER — LIDOCAINE 2% (20 MG/ML) 5 ML SYRINGE
INTRAMUSCULAR | Status: AC
  Filled 2015-12-14: qty 5

## 2015-12-14 MED ORDER — SUGAMMADEX SODIUM 200 MG/2ML IV SOLN
INTRAVENOUS | Status: DC | PRN
  Administered 2015-12-14: 136 mg via INTRAVENOUS

## 2015-12-14 MED ORDER — ACETAMINOPHEN 325 MG PO TABS: 650.0000 mg | ORAL_TABLET | Freq: Four times a day (QID) | ORAL | Status: DC | PRN

## 2015-12-14 MED ORDER — ASPIRIN EC 325 MG PO TBEC
325.0000 mg | DELAYED_RELEASE_TABLET | Freq: Every day | ORAL | Status: DC
  Administered 2015-12-15 – 2015-12-16 (×2): 325 mg via ORAL
  Filled 2015-12-14 (×2): qty 1

## 2015-12-14 MED ORDER — GLYCOPYRROLATE 0.2 MG/ML IV SOSY
PREFILLED_SYRINGE | INTRAVENOUS | Status: AC
Start: 2015-12-14 — End: 2015-12-14
  Filled 2015-12-14: qty 3

## 2015-12-14 MED ORDER — PHENYLEPHRINE 40 MCG/ML (10ML) SYRINGE FOR IV PUSH (FOR BLOOD PRESSURE SUPPORT)
PREFILLED_SYRINGE | INTRAVENOUS | Status: AC
  Filled 2015-12-14: qty 10

## 2015-12-14 SURGICAL SUPPLY — 44 items
BLADE SAW SAG 73X25 THK (BLADE) ×2
BLADE SAW SGTL 73X25 THK (BLADE) ×1 IMPLANT
BRUSH FEMORAL CANAL (MISCELLANEOUS) IMPLANT
CAPT HIP HEMI 1 ×2 IMPLANT
COVER SURGICAL LIGHT HANDLE (MISCELLANEOUS) ×3 IMPLANT
DRAPE IMP U-DRAPE 54X76 (DRAPES) ×3 IMPLANT
DRAPE INCISE IOBAN 66X45 STRL (DRAPES) IMPLANT
DRAPE INCISE IOBAN 85X60 (DRAPES) ×6 IMPLANT
DRAPE ORTHO SPLIT 77X108 STRL (DRAPES) ×6
DRAPE SURG ORHT 6 SPLT 77X108 (DRAPES) ×2 IMPLANT
DRAPE U-SHAPE 47X51 STRL (DRAPES) ×3 IMPLANT
DRSG MEPILEX BORDER 4X12 (GAUZE/BANDAGES/DRESSINGS) ×2 IMPLANT
DRSG MEPILEX BORDER 4X8 (GAUZE/BANDAGES/DRESSINGS) ×2 IMPLANT
DURAPREP 26ML APPLICATOR (WOUND CARE) ×3 IMPLANT
ELECT BLADE 6.5 EXT (BLADE) IMPLANT
ELECT CAUTERY BLADE 6.4 (BLADE) IMPLANT
ELECT REM PT RETURN 9FT ADLT (ELECTROSURGICAL) ×3
ELECTRODE REM PT RTRN 9FT ADLT (ELECTROSURGICAL) ×1 IMPLANT
GAUZE SPONGE 4X4 12PLY STRL (GAUZE/BANDAGES/DRESSINGS) ×1 IMPLANT
GLOVE BIO SURGEON STRL SZ 6.5 (GLOVE) ×1 IMPLANT
GLOVE BIO SURGEONS STRL SZ 6.5 (GLOVE) ×1
GLOVE BIOGEL PI IND STRL 9 (GLOVE) ×1 IMPLANT
GLOVE BIOGEL PI INDICATOR 9 (GLOVE) ×2
GLOVE SURG ORTHO 9.0 STRL STRW (GLOVE) ×5 IMPLANT
GOWN STRL REUS W/ TWL XL LVL3 (GOWN DISPOSABLE) ×1 IMPLANT
GOWN STRL REUS W/TWL XL LVL3 (GOWN DISPOSABLE) ×3
HANDPIECE INTERPULSE COAX TIP (DISPOSABLE)
KIT BASIN OR (CUSTOM PROCEDURE TRAY) ×3 IMPLANT
KIT ROOM TURNOVER OR (KITS) ×3 IMPLANT
MANIFOLD NEPTUNE II (INSTRUMENTS) ×3 IMPLANT
NS IRRIG 1000ML POUR BTL (IV SOLUTION) ×3 IMPLANT
PACK TOTAL JOINT (CUSTOM PROCEDURE TRAY) ×3 IMPLANT
PACK UNIVERSAL I (CUSTOM PROCEDURE TRAY) ×1 IMPLANT
PAD ARMBOARD 7.5X6 YLW CONV (MISCELLANEOUS) ×6 IMPLANT
SET HNDPC FAN SPRY TIP SCT (DISPOSABLE) IMPLANT
STAPLER VISISTAT 35W (STAPLE) ×3 IMPLANT
SUT ETHIBOND NAB CT1 #1 30IN (SUTURE) ×5 IMPLANT
SUT VIC AB 1 CT1 27 (SUTURE) ×3
SUT VIC AB 1 CT1 27XBRD ANBCTR (SUTURE) ×1 IMPLANT
SUT VIC AB 2-0 CTB1 (SUTURE) ×5 IMPLANT
TOWEL OR 17X24 6PK STRL BLUE (TOWEL DISPOSABLE) ×3 IMPLANT
TOWEL OR 17X26 10 PK STRL BLUE (TOWEL DISPOSABLE) ×3 IMPLANT
TOWER CARTRIDGE SMART MIX (DISPOSABLE) IMPLANT
YANKAUER SUCT BULB TIP NO VENT (SUCTIONS) ×2 IMPLANT

## 2015-12-14 NOTE — Progress Notes (Signed)
Report called to Robbie-RN in short stay. All questions/concerns addressed. Will continue to monitor

## 2015-12-14 NOTE — Anesthesia Preprocedure Evaluation (Addendum)
Anesthesia Evaluation  Patient identified by MRN, date of birth, ID band Patient awake    Reviewed: Allergy & Precautions, H&P , NPO status , Patient's Chart, lab work & pertinent test results  History of Anesthesia Complications Negative for: history of anesthetic complications  Airway Mallampati: II  TM Distance: >3 FB Neck ROM: full    Dental  (+) Missing   Pulmonary COPD,    Pulmonary exam normal breath sounds clear to auscultation       Cardiovascular negative cardio ROS Normal cardiovascular exam Rhythm:regular Rate:Normal     Neuro/Psych negative neurological ROS     GI/Hepatic negative GI ROS, Neg liver ROS,   Endo/Other  negative endocrine ROS  Renal/GU negative Renal ROS     Musculoskeletal   Abdominal   Peds  Hematology negative hematology ROS (+)   Anesthesia Other Findings Reports second hand smoke for years  Reproductive/Obstetrics negative OB ROS                            Anesthesia Physical Anesthesia Plan  ASA: II  Anesthesia Plan: General   Post-op Pain Management:    Induction: Intravenous  Airway Management Planned: Oral ETT  Additional Equipment:   Intra-op Plan:   Post-operative Plan: Extubation in OR  Informed Consent: I have reviewed the patients History and Physical, chart, labs and discussed the procedure including the risks, benefits and alternatives for the proposed anesthesia with the patient or authorized representative who has indicated his/her understanding and acceptance.   Dental Advisory Given  Plan Discussed with: Anesthesiologist, CRNA and Surgeon  Anesthesia Plan Comments:         Anesthesia Quick Evaluation

## 2015-12-14 NOTE — Progress Notes (Signed)
Initial Nutrition Assessment  DOCUMENTATION CODES:   Not applicable  INTERVENTION:  Once diet advances, provide Ensure Enlive po BID, each supplement provides 350 kcal and 20 grams of protein.  Encourage adequate PO intake.   NUTRITION DIAGNOSIS:   Increased nutrient needs related to  (post-op healing) as evidenced by estimated needs.  GOAL:   Patient will meet greater than or equal to 90% of their needs  MONITOR:   Diet advancement, Supplement acceptance, Labs, Weight trends, Skin, I & O's  REASON FOR ASSESSMENT:   Consult Assessment of nutrition requirement/status  ASSESSMENT:   70 y.o. female with medical history significant of hyperlipidemia, who presents with left hip pain after fall. X-ray of left hip/pelvis showed subcapital fracture through the left femoral neck, with superior displacement of the distal femur.  Pt is currently NPO for surgery. Pt reports having a lack of appetite since admission. Pt reports eating well PTA with usual consumption of at least 3 meals a day with no other difficulties. Pt reports no changes in weight. Pt is agreeable to Ensure once diet advances to aid in caloric and protein needs as well as in healing. RD to order. RD to provide once diet advances.   Pt with no observed significant fat or muscle mass loss.  Labs and medications reviewed.   Diet Order:  Diet NPO time specified Except for: Sips with Meds  Skin:  Reviewed, no issues  Last BM:  Unknown  Height:   Ht Readings from Last 1 Encounters:  12/13/15 5\' 3"  (1.6 m)    Weight:   Wt Readings from Last 1 Encounters:  12/13/15 150 lb (68 kg)    Ideal Body Weight:  52.27 kg  BMI:  Body mass index is 26.57 kg/m.  Estimated Nutritional Needs:   Kcal:  1700-1900  Protein:  75-85 grams  Fluid:  1.7 - 1.9 L/day  EDUCATION NEEDS:   No education needs identified at this time  Corrin Parker, MS, RD, LDN Pager # [phone] After hours/ weekend pager # [phone]

## 2015-12-14 NOTE — Consult Note (Signed)
ORTHOPAEDIC CONSULTATION  REQUESTING PHYSICIAN: Wallis Bamberg, MD  Chief Complaint: Left hip pain  HPI: Julia Blair is a 70 y.o. female who presents with left femoral neck fracture. Patient states she slipped on wet pavement and landed on her left hip sustaining a left femoral neck fracture.  Past Medical History:  Diagnosis Date  . High cholesterol    Past Surgical History:  Procedure Laterality Date  . ABDOMINAL HYSTERECTOMY     Social History   Social History  . Marital status: Married    Spouse name: N/A  . Number of children: N/A  . Years of education: N/A   Social History Main Topics  . Smoking status: Never Smoker  . Smokeless tobacco: None  . Alcohol use Yes     Comment: Social drink  . Drug use: No  . Sexual activity: Not Asked   Other Topics Concern  . None   Social History Narrative  . None   Family History  Problem Relation Age of Onset  . Asthma Mother   . Throat cancer Father   . Lung cancer Brother    - negative except otherwise stated in the family history section Allergies  Allergen Reactions  . Naproxen     Headache   Prior to Admission medications   Medication Sig Start Date End Date Taking? Authorizing Provider  aspirin 325 MG EC tablet Take 325 mg by mouth daily as needed for pain.   Yes Historical Provider, MD  Coenzyme Q10 (CO Q 10) 100 MG CAPS Take 100 mg by mouth daily.   Yes Historical Provider, MD  Multiple Vitamin (MULTIVITAMIN) tablet Take 1 tablet by mouth daily.   Yes Historical Provider, MD  Red Yeast Rice 600 MG CAPS Take 1,200 mg by mouth daily.   Yes Historical Provider, MD   Dg Chest 1 View  Result Date: 12/13/2015 CLINICAL DATA:  Status post fall, with concern for chest injury. Initial encounter. EXAM: CHEST 1 VIEW COMPARISON:  None. FINDINGS: The lungs are well-aerated and clear. There is no evidence of focal opacification, pleural effusion or pneumothorax. The cardiomediastinal silhouette is mildly  enlarged. No acute osseous abnormalities are seen. IMPRESSION: Mild cardiomegaly. Lungs remain grossly clear. No displaced rib fracture seen. Electronically Signed   By: Garald Balding M.D.   On: 12/13/2015 21:27   Dg Hip Unilat W Or Wo Pelvis 2-3 Views Left  Result Date: 12/13/2015 CLINICAL DATA:  Slipped and fell, with left hip pain. Initial encounter. EXAM: DG HIP (WITH OR WITHOUT PELVIS) 2-3V LEFT COMPARISON:  None. FINDINGS: There is a subcapital fracture through the left femoral neck, with superior displacement of the distal femur. The left femoral head remains seated at the acetabulum. The right hip joint is unremarkable in appearance. No significant degenerative change is appreciated. The sacroiliac joints are unremarkable in appearance. The visualized bowel gas pattern is grossly unremarkable in appearance. IMPRESSION: Subcapital fracture through the left femoral neck, with superior displacement of the distal femur. Electronically Signed   By: Garald Balding M.D.   On: 12/13/2015 21:26   - pertinent xrays, CT, MRI studies were reviewed and independently interpreted  Positive ROS: All other systems have been reviewed and were otherwise negative with the exception of those mentioned in the HPI and as above.  Physical Exam: General: Alert, no acute distress Psychiatric: Patient is competent for consent with normal mood and affect Lymphatic: No axillary or cervical lymphadenopathy Cardiovascular: No pedal edema Respiratory: No cyanosis, no use  of accessory musculature GI: No organomegaly, abdomen is soft and non-tender  Skin: Skin is intact   Neurologic: Patient does have protective sensation bilateral lower extremities.   MUSCULOSKELETAL:  On examination patient's left lower extremity is shortened and externally rotated motion of the left lower extremity reproduces pain. Radiographs are reviewed which shows a displaced femoral neck fracture.  Assessment: Assessment: Displaced left  femoral neck fracture.  Plan: Plan: We'll plan for left hip hemiarthroplasty. Risk and benefits were discussed with the patient and her family including infection neurovascular injury persistent pain DVT need for additional surgery potential for dislocation. Patient states she understands wish to proceed at this time.   Thank you for the consult and the opportunity to see Julia Blair, [place] [phone] 6:25 AM

## 2015-12-14 NOTE — Discharge Instructions (Signed)

## 2015-12-14 NOTE — Progress Notes (Signed)
PT Cancellation Note  Patient Details Name: Julia Blair MRN: [id number] DOB: 06/19/1945   Cancelled Treatment:    Reason Eval/Treat Not Completed: Medical issues which prohibited therapy. Pt awaiting surgery, will follow as ordered.    Cassell Clement, PT, CSCS Pager [phone] Office [phone]  12/14/2015, 3:48 PM

## 2015-12-14 NOTE — Op Note (Signed)
Date of Surgery: 12/14/2015  INDICATIONS: Julia Blair is a 70 y.o.-year-old female who slipped on wet pavement and landed on her left hip sustained a left femoral neck fracture. Patient was evaluated stabilized and brought to the operating room at this time for surgical intervention.  PREOPERATIVE DIAGNOSIS: Left hip femoral neck fracture.  POSTOPERATIVE DIAGNOSIS: Same.  PROCEDURE: Hemiarthroplasty femoral neck fracture   SURGEON: Sharol Given, M.D.  ANESTHESIA:  general  IV FLUIDS AND URINE: See anesthesia.  ESTIMATED BLOOD LOSS: Minimal mL.  COMPLICATIONS: None.  COMPONENT SIZES: Size 3 femur 36 mm head +0 neck  DESCRIPTION OF PROCEDURE: The patient was brought to the operating room and underwent a general anesthetic. After adequate levels of anesthesia were obtained patient was placed in the lateral decubitus position with the hip fracture side up and axillary roll was placed the arms were padded and the mark II supports were placed. The lower extremity was prepped using DuraPrep draped into a sterile field Ioban was used to cover all exposed skin. A timeout was called. A posterior lateral incision was made this was carried down through the tensor fascia lata which was split. Retractors were placed. The piriformis short external rotators and capsule was incised off the femoral neck and retracted with Ethibond suture. The femoral neck cut was made 1 cm proximal to the calcar. The head was delivered. The head was sized and the appropriate size had a good suction fit. The femoral canal was sequentially broached to the proper size. The hip was trialed and this had a good stable fit. The wound was irrigated with normal saline. The femoral component and head were inserted this was again reduced. The hip was stable. The wound was irrigated with normal saline. The capsule short external rotators and piriformis were reapproximated with #1 Ethibond. The tensor fascia lata was closed using #1 Vicryl.  Subcutaneous was closed using 0 Vicryl the skin was closed using staples. A Mepilex dressing was applied. Patient was extubated taken to the PACU in stable condition. [name], MD [place] 6:56 PM

## 2015-12-14 NOTE — Transfer of Care (Signed)
Immediate Anesthesia Transfer of Care Note  Patient: Julia Blair  Procedure(s) Performed: Procedure(s): ARTHROPLASTY BIPOLAR HIP (HEMIARTHROPLASTY) (Left)  Patient Location: PACU  Anesthesia Type:General  Level of Consciousness: awake, alert  and oriented  Airway & Oxygen Therapy: Patient Spontanous Breathing and Patient connected to nasal cannula oxygen  Post-op Assessment: Report given to RN and Post -op Vital signs reviewed and stable  Post vital signs: Reviewed and stable  Last Vitals:  Vitals:   12/14/15 1500 12/14/15 1909  BP: 132/69   Pulse: 62   Resp:    Temp: 36.7 C 36.1 C    Last Pain:  Vitals:   12/14/15 1500  TempSrc: Oral  PainSc:          Complications: No apparent anesthesia complications

## 2015-12-14 NOTE — Progress Notes (Signed)
PROGRESS NOTE    Julia Blair  [id number] DOB: Jul 25, 1945 DOA: 12/13/2015 PCP: [place]    Brief Narrative:  Julia Blair is a 70 y.o. female with medical history significant of hyperlipidemia, who presents with left hip pain after fall.  Pt states that she tripped her steps on the wet cement, fell and injured her left hip, causing severe pain at about 6:00 PM. She strongly denies any injury to head and neck. No headache or neck pain. Her left hip pain is constant, sharp, 10 out of 10 in severity, radiating to the lateral thigh, aggravated by movement. No leg numbness. Patient did not have prodromal symptoms, such as no dizziness, unilateral weakness, chest pain, palpitation. She did not have LOC. Patient denies fever, chills, nausea, vomiting, abdominal pain, diarrhea, symptoms of UTI. Patient was found to have WBC 17.1, INR 1.02, electrolytes renal function okay, temperature normal, no tachycardia, oxygen saturation normal, negative chest x-ray. X-ray of left hip/pelvis showed subcapital fracture through the left femoral neck, with superior displacement of the distal femur. Pt is admitted to [place] bed as inpatient. Orthopedic surgeon, Dr. Sharol Given was consulted.   Assessment & Plan:   Principal Problem:   Closed left hip fracture, initial encounter ([place]) Active Problems:   HLD (hyperlipidemia)   Fall   Leukocytopenia   Closed left hip fracture [facility]):  - No neurovascular compromise - Orthopedic surgeon was consulted - Dr. Sharol Given is planning surgery possibly tomorrow afternoon. - will admit to Med-surg bed - Pain control: morphine prn and percocet - When necessary Zofran for nausea - Robaxin for muscle spasm - type and cross - INR/PTT - SCDs - s/p 1 dose of heparin  Leukocytosis:  - Likely due to stress-induced demargination - CXR negative -Follow-up CBC in am - UA negative  HLD - Last LDL was not on record - LDL of 181  - patient reports  intolerance to statin 2/2 joint pain  DVT ppx: SCD (will give one dose of Sq heparin tonight) Code Status: Full code Family Communication: Discussed plan with patient's sister at bedside Disposition Plan:  Anticipate discharge back to previous home environment Consults called:  Kizzie Ide Admission status: medical floor/ inpt    Consultants:   Orthopedic Surgery (Dr. Sharol Given)  Procedures:   Pending surgery today  Antimicrobials:   Pre- op antibiotic per ortho (Cefazolin)   Subjective: Patient laying in bed and states her hip hurt significantly.  She mentions to me that she feels very old having broken her hip.  She does have a slight headache that she attributes to her lack of caffeine this morning.  She denies being hungry but is hopeful she will go early for surgery.  She does understand that she will likely need rehab after her surgery.    Objective: Vitals:   12/13/15 2030 12/13/15 2230 12/13/15 2321 12/14/15 0642  BP: 144/95 138/85 130/78 115/60  Pulse: 90 77 74 68  Resp: 19 19    Temp:   98.1 F (36.7 C) 98 F (36.7 C)  TempSrc:   Oral Oral  SpO2: 96% 97% 97% 94%  Weight:      Height:        Intake/Output Summary (Last 24 hours) at 12/14/15 1320 Last data filed at 12/14/15 1000  Gross per 24 hour  Intake           613.75 ml  Output              400 ml  Net           213.75 ml   Filed Weights   12/13/15 1953  Weight: 68 kg (150 lb)    Examination:  General exam: Appears calm and comfortable  Respiratory system: Clear to auscultation. Respiratory effort normal. Cardiovascular system: S1 & S2 heard, RRR. No JVD, murmurs, rubs, gallops or clicks. No pedal edema. Gastrointestinal system: Abdomen is nondistended, soft and nontender. No organomegaly or masses felt. Normal bowel sounds heard. Central nervous system: Alert and oriented. No focal neurological deficits. Extremities: Symmetric 5 x 5 power. Skin: No rashes, lesions or ulcers Psychiatry:  Judgement and insight appear normal. Mood & affect appropriate.     Data Reviewed: I have personally reviewed following labs and imaging studies  CBC:  Recent Labs Lab 12/13/15 2129  WBC 17.1*  NEUTROABS 14.9*  HGB 12.7  HCT 39.2  MCV 90.5  PLT [id number]   Basic Metabolic Panel:  Recent Labs Lab 12/13/15 2129  NA 136  K 3.8  CL 103  CO2 23  GLUCOSE 108*  BUN 14  CREATININE 0.73  CALCIUM 9.4   GFR: Estimated Creatinine Clearance: 61.4 mL/min (by C-G formula based on SCr of 0.73 mg/dL). Liver Function Tests: No results for input(s): AST, ALT, ALKPHOS, BILITOT, PROT, ALBUMIN in the last 168 hours. No results for input(s): LIPASE, AMYLASE in the last 168 hours. No results for input(s): AMMONIA in the last 168 hours. Coagulation Profile:  Recent Labs Lab 12/13/15 2129  INR 1.02   Cardiac Enzymes: No results for input(s): CKTOTAL, CKMB, CKMBINDEX, TROPONINI in the last 168 hours. BNP (last 3 results) No results for input(s): PROBNP in the last 8760 hours. HbA1C: No results for input(s): HGBA1C in the last 72 hours. CBG: No results for input(s): GLUCAP in the last 168 hours. Lipid Profile:  Recent Labs  12/14/15 0413  CHOL 278*  HDL 38*  LDLCALC 181*  TRIG 295*  CHOLHDL 7.3   Thyroid Function Tests: No results for input(s): TSH, T4TOTAL, FREET4, T3FREE, THYROIDAB in the last 72 hours. Anemia Panel: No results for input(s): VITAMINB12, FOLATE, FERRITIN, TIBC, IRON, RETICCTPCT in the last 72 hours. Sepsis Labs: No results for input(s): PROCALCITON, LATICACIDVEN in the last 168 hours.  Recent Results (from the past 240 hour(s))  MRSA PCR Screening     Status: None   Collection Time: 12/14/15 12:21 AM  Result Value Ref Range Status   MRSA by PCR NEGATIVE NEGATIVE Final    Comment:        The GeneXpert MRSA Assay (FDA approved for NASAL specimens only), is one component of a comprehensive MRSA colonization surveillance program. It is not intended to  diagnose MRSA infection nor to guide or monitor treatment for MRSA infections.          Radiology Studies: Dg Chest 1 View  Result Date: 12/13/2015 CLINICAL DATA:  Status post fall, with concern for chest injury. Initial encounter. EXAM: CHEST 1 VIEW COMPARISON:  None. FINDINGS: The lungs are well-aerated and clear. There is no evidence of focal opacification, pleural effusion or pneumothorax. The cardiomediastinal silhouette is mildly enlarged. No acute osseous abnormalities are seen. IMPRESSION: Mild cardiomegaly. Lungs remain grossly clear. No displaced rib fracture seen. Electronically Signed   By: Garald Balding M.D.   On: 12/13/2015 21:27   Dg Hip Unilat W Or Wo Pelvis 2-3 Views Left  Result Date: 12/13/2015 CLINICAL DATA:  Slipped and fell, with left hip pain. Initial encounter. EXAM: DG HIP (WITH OR WITHOUT PELVIS)  2-3V LEFT COMPARISON:  None. FINDINGS: There is a subcapital fracture through the left femoral neck, with superior displacement of the distal femur. The left femoral head remains seated at the acetabulum. The right hip joint is unremarkable in appearance. No significant degenerative change is appreciated. The sacroiliac joints are unremarkable in appearance. The visualized bowel gas pattern is grossly unremarkable in appearance. IMPRESSION: Subcapital fracture through the left femoral neck, with superior displacement of the distal femur. Electronically Signed   By: Garald Balding M.D.   On: 12/13/2015 21:26        Scheduled Meds: .  ceFAZolin (ANCEF) IV  2 g Intravenous To SS-Surg  . chlorhexidine  60 mL Topical Once  . multivitamin with minerals  1 tablet Oral Daily   Continuous Infusions: . sodium chloride 75 mL/hr at 12/14/15 1257     LOS: 1 day    Time spent: 35 minutes    Newman Pies, MD [facility] Pager [phone]  If 7PM-7AM, please contact night-coverage [email or web address] Password [email or web address] 12/14/2015, 1:20 PM

## 2015-12-14 NOTE — Progress Notes (Signed)
Patient resting. Had requested pain medication early when OR RN was in room, however patient has been sleeping. Will continue to monitor

## 2015-12-14 NOTE — Anesthesia Postprocedure Evaluation (Signed)
Anesthesia Post Note  Patient: Julia Blair  Procedure(s) Performed: Procedure(s) (LRB): ARTHROPLASTY BIPOLAR HIP (HEMIARTHROPLASTY) (Left)  Patient location during evaluation: PACU Anesthesia Type: General Level of consciousness: awake and alert Pain management: pain level controlled Vital Signs Assessment: post-procedure vital signs reviewed and stable Respiratory status: spontaneous breathing, nonlabored ventilation, respiratory function stable and patient connected to nasal cannula oxygen Cardiovascular status: blood pressure returned to baseline and stable Postop Assessment: no signs of nausea or vomiting Anesthetic complications: no    Last Vitals:  Vitals:   12/14/15 1959 12/14/15 2000  BP:    Pulse: 78 71  Resp: (!) 9 13  Temp:  36.6 C    Last Pain:  Vitals:   12/14/15 1500  TempSrc: Oral  PainSc:                  Harald Quevedo A

## 2015-12-14 NOTE — Progress Notes (Signed)
Pt belongings taken to [place] given to [place], Rn (Silver colored ring and partials)

## 2015-12-15 ENCOUNTER — Encounter (HOSPITAL_COMMUNITY): Payer: Self-pay | Admitting: Orthopedic Surgery

## 2015-12-15 LAB — PTT FACTOR INHIBITOR (MIXING STUDY)
APTT 1 1 NORMAL PLASMA: 23.6 s (ref 22.9–30.2)
aPTT 1:1 Mix Saline: 30.8 s
aPTT: 24 s (ref 22.9–30.2)

## 2015-12-15 LAB — BASIC METABOLIC PANEL
Anion gap: 8 (ref 5–15)
BUN: 6 mg/dL (ref 6–20)
CHLORIDE: 103 mmol/L (ref 101–111)
CO2: 25 mmol/L (ref 22–32)
CREATININE: 0.59 mg/dL (ref 0.44–1.00)
Calcium: 8.8 mg/dL — ABNORMAL LOW (ref 8.9–10.3)
GFR calc Af Amer: >60 mL/min (ref >60)
GFR calc non Af Amer: >60 mL/min (ref >60)
GLUCOSE: 126 mg/dL — AB (ref 65–99)
POTASSIUM: 4.3 mmol/L (ref 3.5–5.1)
Sodium: 136 mmol/L (ref 135–145)

## 2015-12-15 LAB — URINALYSIS, ROUTINE W REFLEX MICROSCOPIC
BILIRUBIN URINE: NEGATIVE
GLUCOSE, UA: NEGATIVE mg/dL
Hgb urine dipstick: NEGATIVE
KETONES UR: NEGATIVE mg/dL
Leukocytes, UA: NEGATIVE
Nitrite: NEGATIVE
PH: 5.5 (ref 5.0–8.0)
PROTEIN: NEGATIVE mg/dL
Specific Gravity, Urine: 1.007 (ref 1.005–1.030)

## 2015-12-15 LAB — CBC
HEMATOCRIT: 36.6 % (ref 36.0–46.0)
HEMOGLOBIN: 11.7 g/dL — AB (ref 12.0–15.0)
MCH: 29 pg (ref 26.0–34.0)
MCHC: 32 g/dL (ref 30.0–36.0)
MCV: 90.8 fL (ref 78.0–100.0)
Platelets: 211 10*3/uL (ref 150–400)
RBC: 4.03 MIL/uL (ref 3.87–5.11)
RDW: 13.4 % (ref 11.5–15.5)
WBC: 9.3 10*3/uL (ref 4.0–10.5)

## 2015-12-15 NOTE — Consult Note (Signed)
[facility] CM Primary Care Navigator  12/15/2015  Julia Blair 01-08-1946 [id number]  Met with patient and sister Julia Blair) at the bedside to identify possible discharge needs. Patient verbalized that she had slipped on wet pavement which caused fracture to hip(left) and led to this admission/ surgery. Patient endorses Dr. Coralyn Mark Daniel/ Tawni Carnes  From [facility] as the primary care providers.    Patient shared using [employer] (at [facility]) to obtain medications with no difficulty and manages her own medicines using "pill box" system.   Prior to admission, she is able to drive. Son Julia Blair) and patient's sister will be able to provide transportation to doctors' appointments after discharge. Both son (who lives currently with patient) and sister will be her primary caregivers at home since husband Julia Blair) is a disabled [place] as stated.   Patient verbalized preferring to be discharged home but ortho note states will anticipate short-term skilled nursing placement.  Patient voiced understanding to call primary care provider's office for a post discharge follow-up appointment within a week or sooner if needed when she gets home. Patient letter provided for her reminder.  She denies additional needs or concerns at this time.  For additional questions please contact:  Edwena Felty A. Dary Dilauro, BSN, RN-BC [facility] PRIMARY CARE Navigator Cell: [phone]

## 2015-12-15 NOTE — Evaluation (Signed)
Physical Therapy Evaluation Patient Details Name: Julia Blair MRN: [id number] DOB: 10/13/1945 Today's Date: 12/15/2015   History of Present Illness  70 y.o. female who slipped on a wet floor, falling and sustaining L hip fx. She underwent L hemiarthroplasty 12-14-15.  Clinical Impression  Pt admitted with above diagnosis. Pt currently with functional limitations due to the deficits listed below (see PT Problem List). On eval, pt required min assist for all functional mobility. She ambulated 15 feet with RW WBAT. Pt educated on 3/3 hip precautions and will need further review. Pt will benefit from skilled PT to increase their independence and safety with mobility to allow discharge to the venue listed below. She has all needed DME at home. Pt's desire is to return home with HHPT. She demo good potential to reach this goal. She has good family support.      Follow Up Recommendations Home health PT;Supervision for mobility/OOB    Equipment Recommendations  None recommended by PT    Recommendations for Other Services       Precautions / Restrictions Precautions Precautions: Fall;Posterior Hip Precaution Booklet Issued: Yes (comment) Precaution Comments: Pt educated on 3/3 hip precautions. Handout provided. Restrictions Weight Bearing Restrictions: Yes LLE Weight Bearing: Weight bearing as tolerated      Mobility  Bed Mobility Overal bed mobility: Needs Assistance Bed Mobility: Supine to Sit     Supine to sit: Min assist;HOB elevated     General bed mobility comments: +rail, verbal cues for sequencing and hip precautions.  Transfers Overall transfer level: Needs assistance Equipment used: Rolling walker (2 wheeled) Transfers: Sit to/from Stand Sit to Stand: Min assist         General transfer comment: verbal cues for hand placement. Once standing, pt performed lateral weight shifts and marching in place x 5 minutes prior to initiating  ambulation.  Ambulation/Gait Ambulation/Gait assistance: Min assist Ambulation Distance (Feet): 15 Feet Assistive device: Rolling walker (2 wheeled) Gait Pattern/deviations: Step-through pattern;Antalgic;Decreased stride length Gait velocity: very slow Gait velocity interpretation: Below normal speed for age/gender General Gait Details: verbal cues for sequencing  Stairs            Wheelchair Mobility    Modified Rankin (Stroke Patients Only)       Balance                                             Pertinent Vitals/Pain Pain Assessment: 0-10 Pain Score: 5  Pain Location: L hip Pain Descriptors / Indicators: Sore;Spasm Pain Intervention(s): Limited activity within patient's tolerance;Monitored during session;Ice applied;Repositioned    Home Living Family/patient expects to be discharged to:: Private residence Living Arrangements: Spouse/significant other;Children Available Help at Discharge: Family;Available 24 hours/day Type of Home: House Home Access: Stairs to enter   [employer] of Steps: 1 Home Layout: Laundry or work area in basement;One level Home Equipment: [place] - 2 wheels;Walker - 4 wheels;Cane - single point;Shower seat - built in;Wheelchair - manual Additional Comments: All equipment owned was previously used by pt's spouse. He now has an electric w/c.    Prior Function Level of Independence: Independent               Hand Dominance        Extremity/Trunk Assessment  Communication   Communication: No difficulties  Cognition Arousal/Alertness: Awake/alert Behavior During Therapy: WFL for tasks assessed/performed Overall Cognitive Status: Within Functional Limits for tasks assessed                      General Comments      Exercises Total Joint Exercises Ankle Circles/Pumps: AROM;10 reps;Both   Assessment/Plan    PT Assessment Patient needs continued PT  services  PT Problem List Decreased strength;Decreased range of motion;Decreased activity tolerance;Decreased balance;Pain;Decreased knowledge of use of DME;Decreased mobility;Decreased knowledge of precautions          PT Treatment Interventions DME instruction;Gait training;Stair training;Functional mobility training;Balance training;Therapeutic exercise;Therapeutic activities;Patient/family education    PT Goals (Current goals can be found in the Care Plan section)  Acute Rehab PT Goals Patient Stated Goal: home PT Goal Formulation: With patient Time For Goal Achievement: 12/29/15 Potential to Achieve Goals: Good    Frequency Min 6X/week   Barriers to discharge        Co-evaluation               End of Session Equipment Utilized During Treatment: Gait belt Activity Tolerance: Patient tolerated treatment well Patient left: in chair;with call bell/phone within reach;with nursing/sitter in room;with family/visitor present Nurse Communication: Mobility status;Precautions         Time: [id number] PT Time Calculation (min) (ACUTE ONLY): 39 min   Charges:   PT Evaluation $PT Eval Moderate Complexity: 1 Procedure PT Treatments $Gait Training: 8-22 mins $Therapeutic Activity: 8-22 mins   PT G Codes:        Lorriane Shire 12/15/2015, 11:35 AM

## 2015-12-15 NOTE — Progress Notes (Signed)
Patient ID: Julia Blair, female   DOB: 07-Apr-1945, 70 y.o.   MRN: [id number] Postoperative day 1 left hip hemiarthroplasty. Patient is doing quite well this morning she is alert oriented no complaints of pain she is moving both lower extremities well anticipate short-term skilled nursing placement.

## 2015-12-15 NOTE — Evaluation (Signed)
Occupational Therapy Evaluation Patient Details Name: Julia Blair MRN: [id number] DOB: 08/16/1945 Today's Date: 12/15/2015    History of Present Illness 70 y.o. female who slipped on a wet floor, falling and sustaining L hip fx. She underwent L hemiarthroplasty 12-14-15.   Clinical Impression   PTA, pt was independent with all ADL and IADL. Currently, pt requires max assist with LB ADL to complete while adhering to hip precautions and min assist during functional mobility. Pt plans to D/C home with assistance from family. Began education concerning hip precautions during ADL, use of AE to improve independence with ADL, ice for pain management, and fall prevention. Recommend home health OT post-acute D/C to improve safety and independence with ADL and IADL in home environment. Pt would benefit from continued OT services to improve independence with ADL while admitted. Also recommend 3-in-1 BSC for DME needs. OT will continue to follow with a focus on use of AE for LB ADL and shower transfer.    Follow Up Recommendations  Home health OT;Supervision/Assistance - 24 hour    Equipment Recommendations  3 in 1 bedside comode       Precautions / Restrictions Precautions Precautions: Fall;Posterior Hip Precaution Booklet Issued: No Precaution Comments: Pt able to recall 2/3 precautions prior to session and 3/3 after OT session. Restrictions Weight Bearing Restrictions: Yes LLE Weight Bearing: Weight bearing as tolerated      Mobility Bed Mobility Overal bed mobility: Needs Assistance Bed Mobility: Supine to Sit     Supine to sit: Min assist;HOB elevated     General bed mobility comments: Pt out of bed on OT arrival.  Transfers Overall transfer level: Needs assistance Equipment used: Rolling walker (2 wheeled) Transfers: Sit to/from Stand Sit to Stand: Min assist         General transfer comment: VC's for hand placement.    Balance Overall balance assessment: Needs  assistance Sitting-balance support: Feet supported;Single extremity supported Sitting balance-Leahy Scale: Fair     Standing balance support: Bilateral upper extremity supported;During functional activity Standing balance-Leahy Scale: Poor                              ADL Overall ADL's : Needs assistance/impaired     Grooming: Oral care;Standing;Min guard   Upper Body Bathing: Supervision/ safety;Sitting   Lower Body Bathing: Maximal assistance;Sit to/from stand   Upper Body Dressing : Supervision/safety;Sitting   Lower Body Dressing: Maximal assistance;Sit to/from stand   Toilet Transfer: Minimal assistance   Toileting- Clothing Manipulation and Hygiene: Moderate assistance Toileting - Clothing Manipulation Details (indicate cue type and reason): Pt concerned about ability to thoroughly manage posterior pericare while adhering to precautions. Tub/ Shower Transfer: Medical sales representative Details (indicate cue type and reason): Pt has walk-in shower with no step up/flat surface entry. Simulated in room. Functional mobility during ADLs: Minimal assistance;Rolling walker General ADL Comments: Educated pt on use of AE for LB ADL                Pertinent Vitals/Pain Pain Assessment: 0-10 Pain Score: 7  Pain Location: L hip Pain Descriptors / Indicators: Operative site guarding;Sore Pain Intervention(s): Limited activity within patient's tolerance;Ice applied;Monitored during session     Hand Dominance Right   Extremity/Trunk Assessment Upper Extremity Assessment Upper Extremity Assessment: Overall WFL for tasks assessed   Lower Extremity Assessment Lower Extremity Assessment: LLE deficits/detail LLE Deficits / Details: Decreased strength and ROM as expected post-operatively.  Communication Communication Communication: No difficulties   Cognition Arousal/Alertness: Awake/alert Behavior During Therapy: WFL for tasks  assessed/performed Overall Cognitive Status: Within Functional Limits for tasks assessed                       Home Living Family/patient expects to be discharged to:: Private residence Living Arrangements: Spouse/significant other;Children Available Help at Discharge: Family;Available 24 hours/day Type of Home: House Home Access: Stairs to enter [employer] of Steps: 1   Home Layout: Laundry or work area in basement;One level     Bathroom Shower/Tub: Walk-in shower;Door (No step up)   Bathroom Toilet: Handicapped height Bathroom Accessibility: Yes How Accessible: Accessible via walker Home Equipment: Reynolds - 2 wheels;Walker - 4 wheels;Cane - single point;Shower seat - built in;Wheelchair - manual   Additional Comments: All equipment owned was previously used by pt's spouse. He now has an electric w/c.      Prior Functioning/Environment Level of Independence: Independent                 OT Problem List: Decreased strength;Decreased range of motion;Decreased activity tolerance;Impaired balance (sitting and/or standing);Pain;Decreased knowledge of use of DME or AE;Decreased knowledge of precautions;Decreased safety awareness   OT Treatment/Interventions: Self-care/ADL training;Therapeutic exercise;DME and/or AE instruction;Patient/family education;Balance training;Therapeutic activities    OT Goals(Current goals can be found in the care plan section) Acute Rehab OT Goals Patient Stated Goal: home OT Goal Formulation: With patient/family Time For Goal Achievement: 12/29/15 Potential to Achieve Goals: Good ADL Goals Pt Will Perform Upper Body Bathing: Independently;sitting Pt Will Perform Lower Body Bathing: with supervision;with adaptive equipment;sit to/from stand Pt Will Perform Upper Body Dressing: Independently;sitting Pt Will Perform Lower Body Dressing: with supervision;with adaptive equipment;sit to/from stand Pt Will Transfer to Toilet: with  supervision;ambulating;bedside commode Pt Will Perform Toileting - Clothing Manipulation and hygiene: with supervision;with adaptive equipment;sit to/from stand Pt Will Perform Tub/Shower Transfer: with supervision;rolling walker;ambulating;3 in 1 Additional ADL Goal #1: Pt will independently verbalize 3/3 hip precautions.  OT Frequency: Min 2X/week    End of Session Equipment Utilized During Treatment: Gait belt;Rolling walker  Activity Tolerance: Patient tolerated treatment well Patient left: in chair;with call bell/phone within reach;with family/visitor present   Time: [id number] OT Time Calculation (min): 32 min Charges:  OT General Charges $OT Visit: 1 Procedure OT Evaluation $OT Eval Moderate Complexity: 1 Procedure OT Treatments $Self Care/Home Management : 8-22 mins  Norman Herrlich, [phone] 12/15/2015, 1:10 PM

## 2015-12-15 NOTE — Progress Notes (Addendum)
PROGRESS NOTE    Julia Blair  [id number] DOB: June 27, 1945 DOA: 12/13/2015 PCP: [place]    Brief Narrative:  Julia Blair is a 70 y.o. female with medical history significant of hyperlipidemia, who presents with left hip pain after fall.  Pt states that she tripped her steps on the wet cement, fell and injured her left hip, causing severe pain at about 6:00 PM. She strongly denies any injury to head and neck. No headache or neck pain. Her left hip pain is constant, sharp, 10 out of 10 in severity, radiating to the lateral thigh, aggravated by movement. No leg numbness. Patient did not have prodromal symptoms, such as no dizziness, unilateral weakness, chest pain, palpitation. She did not have LOC. Patient denies fever, chills, nausea, vomiting, abdominal pain, diarrhea, symptoms of UTI. Patient was found to have WBC 17.1, INR 1.02, electrolytes renal function okay, temperature normal, no tachycardia, oxygen saturation normal, negative chest x-ray. X-ray of left hip/pelvis showed subcapital fracture through the left femoral neck, with superior displacement of the distal femur. Pt is admitted to [facility] bed as inpatient. Orthopedic surgeon, Dr. Sharol Given was consulted.   Assessment & Plan:   Principal Problem:   Closed left hip fracture, initial encounter ([place]) Active Problems:   HLD (hyperlipidemia)   Fall   Leukocytopenia   Closed left hip fracture ([place]):  - No neurovascular compromise - Orthopedic surgeon was consulted - Dr. Sharol Given performed hemiarthroplasty yesterday - will admit to Med-surg bed - Pain control: morphine prn and percocet - When necessary Zofran for nausea - Robaxin for muscle spasm - type and cross - INR/PTT - SCDs - s/p 1 dose of heparin - aspirin 325mg  daily - PT/OT following  Leukocytosis:  - Likely due to stress-induced demargination - CXR negative - WBC today of 9.3 - UA negative  HLD - Last LDL was not on record - LDL of 181   - patient reports intolerance to statin 2/2 joint pain  DVT ppx: SCD (will give one dose of Sq heparin tonight) Code Status: Full code Family Communication: Discussed plan with patient's sister at bedside Disposition Plan:  Anticipate discharge back to previous home environment Consults called:  Kizzie Ide Admission status: medical floor/ inpt    Consultants:   Orthopedic Surgery (Dr. Sharol Given)  Procedures:   Hemiarthroplasty femoral neck fracture 10/9  Antimicrobials:   Pre- op antibiotic per ortho (Cefazolin)   Subjective: Patient states she feels significantly better today than she did yesterday. Pain is vastly improved.  Patient states with her IVF she notes she is urinating very frequently.  Voices some problems with bladder control but that is usual for her.  Patient states that she ate a great breakfast and lunch.  Pain is well controlled.  Hopeful to go home with PT/OT services.  Objective: Vitals:   12/14/15 2118 12/15/15 0145 12/15/15 0425 12/15/15 1344  BP: 117/66 112/66 125/70 116/70  Pulse: 64 65 72 71  Resp: 14 14 16 17   Temp: 97.9 F (36.6 C) 98.1 F (36.7 C) 98.6 F (37 C) 98.7 F (37.1 C)  TempSrc: Oral Oral Oral Oral  SpO2: 98% 98% 97% 97%  Weight:      Height:        Intake/Output Summary (Last 24 hours) at 12/15/15 1412 Last data filed at 12/15/15 1344  Gross per 24 hour  Intake             3887 ml  Output  1853 ml  Net             2034 ml   Filed Weights   12/13/15 1953  Weight: 68 kg (150 lb)    Examination:  General exam: Appears calm and comfortable  Respiratory system: Clear to auscultation. Respiratory effort normal. Cardiovascular system: S1 & S2 heard, RRR. No JVD, murmurs, rubs, gallops or clicks. No pedal edema. Gastrointestinal system: Abdomen is nondistended, soft and nontender. No organomegaly or masses felt. Normal bowel sounds heard. Central nervous system: Alert and oriented. No focal neurological  deficits. Extremities: left foot neurovascularly intact, able to plantar and dorsi flex feet bilaterally. Patient reports she has been able to ambulate Skin: No rashes, lesions or ulcers Psychiatry: Judgement and insight appear normal. Mood & affect appropriate.     Data Reviewed: I have personally reviewed following labs and imaging studies  CBC:  Recent Labs Lab 12/13/15 2129 12/15/15 0546  WBC 17.1* 9.3  NEUTROABS 14.9*  --   HGB 12.7 11.7*  HCT 39.2 36.6  MCV 90.5 90.8  PLT 268 [id number]   Basic Metabolic Panel:  Recent Labs Lab 12/13/15 2129 12/15/15 0546  NA 136 136  K 3.8 4.3  CL 103 103  CO2 23 25  GLUCOSE 108* 126*  BUN 14 6  CREATININE 0.73 0.59  CALCIUM 9.4 8.8*   GFR: Estimated Creatinine Clearance: 61.4 mL/min (by C-G formula based on SCr of 0.59 mg/dL). Liver Function Tests: No results for input(s): AST, ALT, ALKPHOS, BILITOT, PROT, ALBUMIN in the last 168 hours. No results for input(s): LIPASE, AMYLASE in the last 168 hours. No results for input(s): AMMONIA in the last 168 hours. Coagulation Profile:  Recent Labs Lab 12/13/15 2129  INR 1.02   Cardiac Enzymes: No results for input(s): CKTOTAL, CKMB, CKMBINDEX, TROPONINI in the last 168 hours. BNP (last 3 results) No results for input(s): PROBNP in the last 8760 hours. HbA1C: No results for input(s): HGBA1C in the last 72 hours. CBG: No results for input(s): GLUCAP in the last 168 hours. Lipid Profile:  Recent Labs  12/14/15 0413  CHOL 278*  HDL 38*  LDLCALC 181*  TRIG 295*  CHOLHDL 7.3   Thyroid Function Tests: No results for input(s): TSH, T4TOTAL, FREET4, T3FREE, THYROIDAB in the last 72 hours. Anemia Panel: No results for input(s): VITAMINB12, FOLATE, FERRITIN, TIBC, IRON, RETICCTPCT in the last 72 hours. Sepsis Labs: No results for input(s): PROCALCITON, LATICACIDVEN in the last 168 hours.  Recent Results (from the past 240 hour(s))  MRSA PCR Screening     Status: None    Collection Time: 12/14/15 12:21 AM  Result Value Ref Range Status   MRSA by PCR NEGATIVE NEGATIVE Final    Comment:        The GeneXpert MRSA Assay (FDA approved for NASAL specimens only), is one component of a comprehensive MRSA colonization surveillance program. It is not intended to diagnose MRSA infection nor to guide or monitor treatment for MRSA infections.          Radiology Studies: Dg Chest 1 View  Result Date: 12/13/2015 CLINICAL DATA:  Status post fall, with concern for chest injury. Initial encounter. EXAM: CHEST 1 VIEW COMPARISON:  None. FINDINGS: The lungs are well-aerated and clear. There is no evidence of focal opacification, pleural effusion or pneumothorax. The cardiomediastinal silhouette is mildly enlarged. No acute osseous abnormalities are seen. IMPRESSION: Mild cardiomegaly. Lungs remain grossly clear. No displaced rib fracture seen. Electronically Signed   By: Francoise Schaumann.D.  On: 12/13/2015 21:27   Dg Hip Unilat W Or Wo Pelvis 2-3 Views Left  Result Date: 12/13/2015 CLINICAL DATA:  Slipped and fell, with left hip pain. Initial encounter. EXAM: DG HIP (WITH OR WITHOUT PELVIS) 2-3V LEFT COMPARISON:  None. FINDINGS: There is a subcapital fracture through the left femoral neck, with superior displacement of the distal femur. The left femoral head remains seated at the acetabulum. The right hip joint is unremarkable in appearance. No significant degenerative change is appreciated. The sacroiliac joints are unremarkable in appearance. The visualized bowel gas pattern is grossly unremarkable in appearance. IMPRESSION: Subcapital fracture through the left femoral neck, with superior displacement of the distal femur. Electronically Signed   By: Garald Balding M.D.   On: 12/13/2015 21:26        Scheduled Meds: . aspirin EC  325 mg Oral Q breakfast  . feeding supplement (ENSURE ENLIVE)  237 mL Oral BID BM  . multivitamin with minerals  1 tablet Oral Daily    Continuous Infusions: . sodium chloride 75 mL/hr (12/15/15 0130)  . lactated ringers 10 mL/hr at 12/14/15 1551     LOS: 2 days    Time spent: 35 minutes    Newman Pies, MD [facility] Pager [phone]  If 7PM-7AM, please contact night-coverage [email or web address] Password [email or web address] 12/15/2015, 2:12 PM

## 2015-12-16 DIAGNOSIS — R2681 Unsteadiness on feet: Secondary | ICD-10-CM

## 2015-12-16 DIAGNOSIS — E7800 Pure hypercholesterolemia, unspecified: Secondary | ICD-10-CM

## 2015-12-16 DIAGNOSIS — D700 Congenital agranulocytosis: Secondary | ICD-10-CM

## 2015-12-16 DIAGNOSIS — S72002A Fracture of unspecified part of neck of left femur, initial encounter for closed fracture: Secondary | ICD-10-CM

## 2015-12-16 LAB — BASIC METABOLIC PANEL
ANION GAP: 5 (ref 5–15)
BUN: 11 mg/dL (ref 6–20)
CALCIUM: 8.4 mg/dL — AB (ref 8.9–10.3)
CO2: 27 mmol/L (ref 22–32)
CREATININE: 0.7 mg/dL (ref 0.44–1.00)
Chloride: 106 mmol/L (ref 101–111)
Glucose, Bld: 102 mg/dL — ABNORMAL HIGH (ref 65–99)
Potassium: 4.4 mmol/L (ref 3.5–5.1)
SODIUM: 138 mmol/L (ref 135–145)

## 2015-12-16 LAB — CBC
HCT: 29.7 % — ABNORMAL LOW (ref 36.0–46.0)
Hemoglobin: 9.4 g/dL — ABNORMAL LOW (ref 12.0–15.0)
MCH: 28.5 pg (ref 26.0–34.0)
MCHC: 31.6 g/dL (ref 30.0–36.0)
MCV: 90 fL (ref 78.0–100.0)
Platelets: 187 10*3/uL (ref 150–400)
RBC: 3.3 MIL/uL — ABNORMAL LOW (ref 3.87–5.11)
RDW: 13.5 % (ref 11.5–15.5)
WBC: 8.6 10*3/uL (ref 4.0–10.5)

## 2015-12-16 MED ORDER — PANTOPRAZOLE SODIUM 40 MG PO TBEC: 40.0000 mg | DELAYED_RELEASE_TABLET | Freq: Every day | ORAL | 0 refills | Status: DC

## 2015-12-16 MED ORDER — HYDROCODONE-ACETAMINOPHEN 5-325 MG PO TABS: 1.0000 | ORAL_TABLET | Freq: Four times a day (QID) | ORAL | 0 refills | Status: DC | PRN

## 2015-12-16 MED ORDER — SENNOSIDES-DOCUSATE SODIUM 8.6-50 MG PO TABS: 1.0000 | ORAL_TABLET | Freq: Every evening | ORAL | 1 refills | Status: DC | PRN

## 2015-12-16 MED ORDER — ASPIRIN EC 325 MG PO TBEC: 325.0000 mg | DELAYED_RELEASE_TABLET | Freq: Every day | ORAL | 0 refills | Status: DC

## 2015-12-16 NOTE — Discharge Summary (Signed)
Physician Discharge Summary  Julia Blair MRN: [id number] DOB/AGE: [birth year] 70 y.o.  PCP: [facility] date: 12/13/2015 Discharge date: 12/16/2015  Discharge Diagnoses:    Principal Problem:   Closed left hip fracture, initial encounter [facility]) Active Problems:   HLD (hyperlipidemia)   Fall   Leukocytopenia   Unsteadiness on feet Status post Hemiarthroplasty femoral neck fracture   Follow-up recommendations Follow-up with PCP in 3-5 days , including all  additional recommended appointments as below Follow-up CBC, CMP in 3-5 days Follow-up with orthopedics, Dr. Sharol Given in 2 weeks Patient declined SNF, being discharged with home health      Current Discharge Medication List    START taking these medications   Details  acetaminophen (TYLENOL) 500 MG tablet Take 1 tablet (500 mg total) by mouth every 6 (six) hours as needed for mild pain. Qty: 30 tablet, Refills: 0    HYDROcodone-acetaminophen (NORCO/VICODIN) 5-325 MG tablet Take 1 tablet by mouth every 6 (six) hours as needed for moderate pain. Qty: 20 tablet, Refills: 0    pantoprazole (PROTONIX) 40 MG tablet Take 1 tablet (40 mg total) by mouth daily. Qty: 30 tablet, Refills: 0    senna-docusate (SENOKOT-S) 8.6-50 MG tablet Take 1 tablet by mouth at bedtime as needed for mild constipation. Qty: 60 tablet, Refills: 1      CONTINUE these medications which have CHANGED   Details  aspirin EC 325 MG tablet Take 1 tablet (325 mg total) by mouth daily. Qty: 30 tablet, Refills: 0      CONTINUE these medications which have NOT CHANGED   Details  Coenzyme Q10 (CO Q 10) 100 MG CAPS Take 100 mg by mouth daily.    Multiple Vitamin (MULTIVITAMIN) tablet Take 1 tablet by mouth daily.    Red Yeast Rice 600 MG CAPS Take 1,200 mg by mouth daily.        Discharge Condition: Stable  Discharge Instructions Get Medicines reviewed and adjusted: Please take all your medications with you for your next  visit with your Primary MD  Please request your Primary MD to go over all hospital tests and procedure/radiological results at the follow up, please ask your Primary MD to get all Hospital records sent to his/her office.  If you experience worsening of your admission symptoms, develop shortness of breath, life threatening emergency, suicidal or homicidal thoughts you must seek medical attention immediately by calling 911 or calling your MD immediately if symptoms less severe.  You must read complete instructions/literature along with all the possible adverse reactions/side effects for all the Medicines you take and that have been prescribed to you. Take any new Medicines after you have completely understood and accpet all the possible adverse reactions/side effects.   Do not drive when taking Pain medications.   Do not take more than prescribed Pain, Sleep and Anxiety Medications  Special Instructions: If you have smoked or chewed Tobacco in the last 2 yrs please stop smoking, stop any regular Alcohol and or any Recreational drug use.  Wear Seat belts while driving.  Please note  You were cared for by a hospitalist during your hospital stay. Once you are discharged, your primary care physician will handle any further medical issues. Please note that NO REFILLS for any discharge medications will be authorized once you are discharged, as it is imperative that you return to your primary care physician (or establish a relationship with a primary care physician if you do not have one) for your aftercare  needs so that they can reassess your need for medications and monitor your lab values.  Discharge Instructions    Weight bearing as tolerated    Complete by:  As directed    Laterality:  left   Extremity:  Lower       Allergies  Allergen Reactions  . Naproxen     Headache      Disposition: Home with home health   Consults: Orthopedics   Significant Diagnostic Studies:  Dg  Chest 1 View  Result Date: 12/13/2015 CLINICAL DATA:  Status post fall, with concern for chest injury. Initial encounter. EXAM: CHEST 1 VIEW COMPARISON:  None. FINDINGS: The lungs are well-aerated and clear. There is no evidence of focal opacification, pleural effusion or pneumothorax. The cardiomediastinal silhouette is mildly enlarged. No acute osseous abnormalities are seen. IMPRESSION: Mild cardiomegaly. Lungs remain grossly clear. No displaced rib fracture seen. Electronically Signed   By: Garald Balding M.D.   On: 12/13/2015 21:27   Dg Hip Unilat W Or Wo Pelvis 2-3 Views Left  Result Date: 12/13/2015 CLINICAL DATA:  Slipped and fell, with left hip pain. Initial encounter. EXAM: DG HIP (WITH OR WITHOUT PELVIS) 2-3V LEFT COMPARISON:  None. FINDINGS: There is a subcapital fracture through the left femoral neck, with superior displacement of the distal femur. The left femoral head remains seated at the acetabulum. The right hip joint is unremarkable in appearance. No significant degenerative change is appreciated. The sacroiliac joints are unremarkable in appearance. The visualized bowel gas pattern is grossly unremarkable in appearance. IMPRESSION: Subcapital fracture through the left femoral neck, with superior displacement of the distal femur. Electronically Signed   By: Garald Balding M.D.   On: 12/13/2015 21:26        Filed Weights   12/13/15 1953  Weight: 68 kg (150 lb)     Microbiology: Recent Results (from the past 240 hour(s))  MRSA PCR Screening     Status: None   Collection Time: 12/14/15 12:21 AM  Result Value Ref Range Status   MRSA by PCR NEGATIVE NEGATIVE Final    Comment:        The GeneXpert MRSA Assay (FDA approved for NASAL specimens only), is one component of a comprehensive MRSA colonization surveillance program. It is not intended to diagnose MRSA infection nor to guide or monitor treatment for MRSA infections.        Blood Culture No results found for:  SDES, SPECREQUEST, CULT, REPTSTATUS    Labs: Results for orders placed or performed during the hospital encounter of 12/13/15 (from the past 48 hour(s))  CBC     Status: Abnormal   Collection Time: 12/15/15  5:46 AM  Result Value Ref Range   WBC 9.3 4.0 - 10.5 K/uL   RBC 4.03 3.87 - 5.11 MIL/uL   Hemoglobin 11.7 (L) 12.0 - 15.0 g/dL   HCT 36.6 36.0 - 46.0 %   MCV 90.8 78.0 - 100.0 fL   MCH 29.0 26.0 - 34.0 pg   MCHC 32.0 30.0 - 36.0 g/dL   RDW 13.4 11.5 - 15.5 %   Platelets 211 150 - 400 K/uL  Basic metabolic panel     Status: Abnormal   Collection Time: 12/15/15  5:46 AM  Result Value Ref Range   Sodium 136 135 - 145 mmol/L   Potassium 4.3 3.5 - 5.1 mmol/L   Chloride 103 101 - 111 mmol/L   CO2 25 22 - 32 mmol/L   Glucose, Bld 126 (H) 65 -  99 mg/dL   BUN 6 6 - 20 mg/dL   Creatinine, Ser 2.72 0.44 - 1.00 mg/dL   Calcium 8.8 (L) 8.9 - 10.3 mg/dL   GFR calc non Af Amer >60 >60 mL/min   GFR calc Af Amer >60 >60 mL/min    Comment: (NOTE) The eGFR has been calculated using the CKD EPI equation. This calculation has not been validated in all clinical situations. eGFR's persistently <60 mL/min signify possible Chronic Kidney Disease.    Anion gap 8 5 - 15  Urinalysis, Routine w reflex microscopic (not at [facility])     Status: None   Collection Time: 12/15/15  6:48 AM  Result Value Ref Range   Color, Urine YELLOW YELLOW   APPearance CLEAR CLEAR   Specific Gravity, Urine 1.007 1.005 - 1.030   pH 5.5 5.0 - 8.0   Glucose, UA NEGATIVE NEGATIVE mg/dL   Hgb urine dipstick NEGATIVE NEGATIVE   Bilirubin Urine NEGATIVE NEGATIVE   Ketones, ur NEGATIVE NEGATIVE mg/dL   Protein, ur NEGATIVE NEGATIVE mg/dL   Nitrite NEGATIVE NEGATIVE   Leukocytes, UA NEGATIVE NEGATIVE    Comment: MICROSCOPIC NOT DONE ON URINES WITH NEGATIVE PROTEIN, BLOOD, LEUKOCYTES, NITRITE, OR GLUCOSE <1000 mg/dL.  CBC     Status: Abnormal   Collection Time: 12/16/15  2:44 AM  Result Value Ref Range   WBC 8.6 4.0 -  10.5 K/uL   RBC 3.30 (L) 3.87 - 5.11 MIL/uL   Hemoglobin 9.4 (L) 12.0 - 15.0 g/dL   HCT 69.1 (L) 31.4 - 75.2 %   MCV 90.0 78.0 - 100.0 fL   MCH 28.5 26.0 - 34.0 pg   MCHC 31.6 30.0 - 36.0 g/dL   RDW 93.9 70.1 - 25.8 %   Platelets 187 150 - 400 K/uL  Basic metabolic panel     Status: Abnormal   Collection Time: 12/16/15  2:44 AM  Result Value Ref Range   Sodium 138 135 - 145 mmol/L   Potassium 4.4 3.5 - 5.1 mmol/L   Chloride 106 101 - 111 mmol/L   CO2 27 22 - 32 mmol/L   Glucose, Bld 102 (H) 65 - 99 mg/dL   BUN 11 6 - 20 mg/dL   Creatinine, Ser 9.09 0.44 - 1.00 mg/dL   Calcium 8.4 (L) 8.9 - 10.3 mg/dL   GFR calc non Af Amer >60 >60 mL/min   GFR calc Af Amer >60 >60 mL/min    Comment: (NOTE) The eGFR has been calculated using the CKD EPI equation. This calculation has not been validated in all clinical situations. eGFR's persistently <60 mL/min signify possible Chronic Kidney Disease.    Anion gap 5 5 - 15     Lipid Panel     Component Value Date/Time   CHOL 278 (H) 12/14/2015 0413   TRIG 295 (H) 12/14/2015 0413   HDL 38 (L) 12/14/2015 0413   CHOLHDL 7.3 12/14/2015 0413   VLDL 59 (H) 12/14/2015 0413   LDLCALC 181 (H) 12/14/2015 0413     No results found for: HGBA1C   Lab Results  Component Value Date   LDLCALC 181 (H) 12/14/2015   CREATININE 0.70 12/16/2015     HPI   70 y.o.femalewith medical history significant of hyperlipidemia, who presents with left hip pain after fall.  Pt states that she tripped her steps on the wet cement, fell and injured her left hip, causing severe pain at about 6:00 PM. She strongly denies any injury to head and neck. No headache or  neck pain. Her left hip pain is constant, sharp, 10 out of 10 in severity, radiating to the lateral thigh, aggravated by movement. No leg numbness. Patient did not have prodromal symptoms, such as no dizziness, unilateral weakness, chest pain, palpitation. She did not have LOC. Patient denies fever,  chills, nausea, vomiting, abdominal pain, diarrhea, symptoms of UTI. Patient was found to have WBC 17.1, INR 1.02, electrolytes renal function okay, temperature normal, no tachycardia, oxygen saturation normal, negative chest x-ray. X-ray of left hip/pelvis showed subcapital fracture through the left femoral neck, with superior displacement of the distal femur. Pt is admitted to [facility] bed as inpatient. Orthopedic surgeon, Dr. Sharol Given was consulted.  HOSPITAL COURSE  Closed left hip fracture ([place]): - No neurovascular compromise - Orthopedic surgeon was consulted - Dr. Sharol Given performed hemiarthroplasty  10/9 Patient monitored on Med-surg bed - Pain control: Tylenol/Vicodin Pain controlled on the day of discharge  Some blood loss anemia seen, hemoglobin dropped from 12.7> 9.4 Started on aspirin for DVT prophylaxis Will start patient on a PPI and adnexa and PT OT evaluated the patient and recommended SNF but patient declined and chose to go home with home health   Leukocytosis:  - Likely due to stress-induced demargination - CXR negative - WBC today of 9.3 - UA negative  HLD - Last LDL was not on record - LDL of 181  - patient reports intolerance to statin 2/2 joint pain   Discharge Exam:   Blood pressure 102/60, pulse 62, temperature 98.2 F (36.8 C), temperature source Oral, resp. rate 16, height '5\' 3"'$  (1.6 m), weight 68 kg (150 lb), SpO2 98 %.   General exam: Appears calm and comfortable  Respiratory system: Clear to auscultation. Respiratory effort normal. Cardiovascular system: S1 & S2 heard, RRR. No JVD, murmurs, rubs, gallops or clicks. No pedal edema. Gastrointestinal system: Abdomen is nondistended, soft and nontender. No organomegaly or masses felt. Normal bowel sounds heard. Central nervous system: Alert and oriented. No focal neurological deficits. Extremities: left foot neurovascularly intact, able to plantar and dorsi flex feet bilaterally. Patient reports she has been  able to ambulate Skin: No rashes, lesions or ulcers Psychiatry: Judgement and insight appear normal. Mood & affect appropriate.     Follow-up Information    Newt Minion, MD Follow up in 2 week(s).   Specialty:  Orthopedic Surgery Contact information: [place] [address] [phone]        [place]. Schedule an appointment as soon as possible for a visit in 2 day(s).   Why:  Hospital follow-up, call PCP to make follow-up appointment Contact information: [place] [address] [phone]           Signed: Reyne Dumas 12/16/2015, 8:14 AM        Time spent >45 mins

## 2015-12-16 NOTE — Care Management Note (Signed)
Case Management Note  Patient Details  Name: Julia Blair MRN: [id number] Date of Birth: 18-Sep-1945  Subjective/Objective:    69 yr old female s/p left hip fracture, underwent a left hip hemiarthroplasty.                 Action/Plan: Case manager spoke with patient concerning home halth and DME needs for discharge. Choice was offered for home health agency. Referral was given to [facility], [facility]. Patient states her sister will be bringing her a 3in1. CM has ordered rolling walker. Patient states she will have family support at discharge.     Expected Discharge Date:   12/17/15               Expected Discharge Plan:  [place]  In-House Referral:     Discharge planning Services  CM Consult  Post Acute Care Choice:  Home Health Choice offered to:  Patient  DME Arranged:  N/A DME Agency:     HH Arranged:  PT [place]:  [place]  Status of Service:  Completed, signed off  If discussed at [place] of Stay Meetings, dates discussed:    Additional Comments:  Ninfa Meeker, RN 12/16/2015, 12:29 PM

## 2015-12-16 NOTE — Progress Notes (Signed)
Occupational Therapy Treatment Patient Details Name: Julia Blair MRN: [id number] DOB: Oct 29, 1945 Today's Date: 12/16/2015    History of present illness 70 y.o. female who slipped on a wet floor, falling and sustaining L hip fx. She underwent L hemiarthroplasty 12-14-15.   OT comments  Pt limited by pain this date and requiring increased assist with functional transfers. Currently requires min assist with functional ambulation and LB ADL secondary to pain. Completed pt/family education in preparation for safe D/C home. Pt and sister educated on use of AE to improve independence with ADL, safe shower transfer, ice for pain management and fall prevention. Continue to recommend home health OT for OT follow-up and 3-in-1 [facility] for DME needs. Will continue to follow while admitted to focus on shower transfers and LB ADL while adhering to precautions.   Follow Up Recommendations  Home health OT;Supervision/Assistance - 24 hour    Equipment Recommendations  3 in 1 bedside comode       Precautions / Restrictions Precautions Precautions: Fall;Posterior Hip Precaution Booklet Issued: No Precaution Comments: Continued education on precautions. Pt able to recall during functional activity. Restrictions Weight Bearing Restrictions: Yes LLE Weight Bearing: Weight bearing as tolerated       Mobility Bed Mobility Overal bed mobility: Needs Assistance Bed Mobility: Supine to Sit;Sit to Supine     Supine to sit: Min assist;HOB elevated Sit to supine: Min assist;HOB elevated   General bed mobility comments: Pt with decreased independence with bed mobility secondary to pain this date.  Transfers Overall transfer level: Needs assistance Equipment used: Rolling walker (2 wheeled) Transfers: Sit to/from Stand Sit to Stand: Min assist (Increased assist secondary to pain.)         General transfer comment: VC's for hand placement.    Balance Overall balance assessment: Needs  assistance Sitting-balance support: Feet supported;Single extremity supported Sitting balance-Leahy Scale: Fair   Postural control: Posterior lean Standing balance support: Bilateral upper extremity supported;During functional activity Standing balance-Leahy Scale: Poor                     ADL Overall ADL's : Needs assistance/impaired                 Upper Body Dressing : Supervision/safety;Sitting   Lower Body Dressing: Minimal assistance;Sit to/from stand;With adaptive equipment Lower Body Dressing Details (indicate cue type and reason): Min assist to lift leg to thread into pants. Toilet Transfer: Minimal assistance;Ambulation;BSC;RW       Tub/ Shower Transfer: Minimal assistance;Ambulation;3 in 1;Grab bars;Rolling walker   Functional mobility during ADLs: Minimal assistance;Rolling walker General ADL Comments: Continued education of AE for LB ADL and pt demonstrates understanding. Completed education on safe shower transfer and need for family assist with this.                Cognition   Behavior During Therapy: WFL for tasks assessed/performed Overall Cognitive Status: Within Functional Limits for tasks assessed                         Exercises Total Joint Exercises Ankle Circles/Pumps: AROM;Both;5 reps;Supine Quad Sets: AROM;Left;5 reps;Supine Short Arc Quad: AROM;Left;5 reps;Supine Heel Slides: Left;5 reps;Supine;AAROM Hip ABduction/ADduction: AAROM;Left;Supine;AROM;Standing;10 reps (1x5 AAROM in sitting and 1x5 AROM in standing.  ) Long Arc Quad: AROM;Left;5 reps;Seated Knee Flexion: AROM;Left;5 reps;Standing Marching in Standing: AROM;Left;5 reps;Standing Standing Hip Extension: AROM;Left;5 reps;Standing     Pertinent Vitals/ Pain       Pain Assessment: 0-10  Pain Score: 8  Pain Location: L hip Pain Descriptors / Indicators: Operative site guarding;Aching;Sore Pain Intervention(s): Limited activity within patient's  tolerance;Monitored during session;Repositioned;Ice applied;Patient requesting pain meds-RN notified         Frequency  Min 2X/week        Progress Toward Goals  OT Goals(current goals can now be found in the care plan section)  Progress towards OT goals: Progressing toward goals  Acute Rehab OT Goals Patient Stated Goal: home OT Goal Formulation: With patient/family Time For Goal Achievement: 12/29/15 Potential to Achieve Goals: Good ADL Goals Pt Will Perform Upper Body Bathing: Independently;sitting Pt Will Perform Lower Body Bathing: with supervision;with adaptive equipment;sit to/from stand Pt Will Perform Upper Body Dressing: Independently;sitting Pt Will Perform Lower Body Dressing: with supervision;with adaptive equipment;sit to/from stand Pt Will Transfer to Toilet: with supervision;ambulating;bedside commode Pt Will Perform Toileting - Clothing Manipulation and hygiene: with supervision;with adaptive equipment;sit to/from stand Pt Will Perform Tub/Shower Transfer: with supervision;rolling walker;ambulating;3 in 1 Additional ADL Goal #1: Pt will independently verbalize 3/3 hip precautions.  Plan Discharge plan remains appropriate       End of Session Equipment Utilized During Treatment: Gait belt;Rolling walker   Activity Tolerance Patient limited by pain   Patient Left in bed;with call bell/phone within reach   Nurse Communication Patient requests pain meds        Time: [id number] OT Time Calculation (min): 51 min  Charges: OT General Charges $OT Visit: 1 Procedure OT Treatments $Self Care/Home Management : 38-52 mins  Norman Herrlich, OTR/L [phone] 12/16/2015, 2:50 PM

## 2015-12-16 NOTE — Progress Notes (Signed)
Reviewed Discharge papers and medication with pt and sister with full understanding

## 2015-12-16 NOTE — Progress Notes (Signed)
Physical Therapy Treatment Patient Details Name: Julia Blair MRN: [id number] DOB: [birth year] Today's Date: 12/16/2015    History of Present Illness 70 y.o. female who slipped on a wet floor, falling and sustaining L hip fx. She underwent L hemiarthroplasty 12-14-15.    PT Comments    Reviewed exercises, stair training and gait training to ensure safe d/c home.    Follow Up Recommendations  Home health PT;Supervision for mobility/OOB     Equipment Recommendations  None recommended by PT    Recommendations for Other Services       Precautions / Restrictions Precautions Precautions: Fall;Posterior Hip Precaution Booklet Issued: No Precaution Comments: Pt able to recall 2/3 precautions prior to session and 3/3 after PT session. Restrictions Weight Bearing Restrictions: Yes LLE Weight Bearing: Weight bearing as tolerated    Mobility  Bed Mobility Overal bed mobility: Needs Assistance Bed Mobility: Supine to Sit     Supine to sit: Min assist     General bed mobility comments: Pt required use of gait belt strap for OOB activity.  Pt able to advance LLE with gait belt and use of rails for upper trunk.  Pt educated that she may benefit from a leg lifter for assist at home.  Pt also reports she will have a hospital bed at home.    Transfers Overall transfer level: Needs assistance Equipment used: Rolling walker (2 wheeled) Transfers: Sit to/from Stand Sit to Stand: Supervision         General transfer comment: VC's for hand placement.  Ambulation/Gait Ambulation/Gait assistance: Min guard;Supervision Ambulation Distance (Feet): 300 Feet Assistive device: Rolling walker (2 wheeled) Gait Pattern/deviations: Step-through pattern;Step-to pattern;Trunk flexed;Shuffle;Decreased stride length Gait velocity: very slow   General Gait Details: VCs for progression to step through pattern and for L turns to avoid IR.     Stairs Stairs: Yes Stairs assistance: Min  guard Stair Management: No rails;With walker;Backwards;Forwards;Step to pattern Number of Stairs: 2 General stair comments: Pt performed x2 trials, x1 forwards to ascend and x1 backwards to ascend.  Pt required cues for sequencing and RW placement.    Wheelchair Mobility    Modified Rankin (Stroke Patients Only)       Balance Overall balance assessment: Needs assistance   Sitting balance-Leahy Scale: Fair       Standing balance-Leahy Scale: Poor                      Cognition Arousal/Alertness: Awake/alert Behavior During Therapy: WFL for tasks assessed/performed Overall Cognitive Status: Within Functional Limits for tasks assessed                      Exercises Total Joint Exercises Ankle Circles/Pumps: AROM;Both;5 reps;Supine Quad Sets: AROM;Left;5 reps;Supine Short Arc Quad: AROM;Left;5 reps;Supine Heel Slides: Left;5 reps;Supine;AAROM Hip ABduction/ADduction: AAROM;Left;Supine;AROM;Standing;10 reps (1x5 AAROM in sitting and 1x5 AROM in standing.  ) Long Arc Quad: AROM;Left;5 reps;Seated Knee Flexion: AROM;Left;5 reps;Standing Marching in Standing: AROM;Left;5 reps;Standing Standing Hip Extension: AROM;Left;5 reps;Standing    General Comments        Pertinent Vitals/Pain Pain Assessment: 0-10 Pain Score: 5  Pain Location: L hip Pain Descriptors / Indicators: Operative site guarding;Sore Pain Intervention(s): Monitored during session;Repositioned;Ice applied    Home Living                      Prior Function            PT Goals (current goals can now be  found in the care plan section) Acute Rehab PT Goals Patient Stated Goal: home Potential to Achieve Goals: Good Progress towards PT goals: Progressing toward goals    Frequency    Min 6X/week      PT Plan Current plan remains appropriate    Co-evaluation             End of Session Equipment Utilized During Treatment: Gait belt Activity Tolerance: Patient  tolerated treatment well Patient left: in chair;with call bell/phone within reach;with nursing/sitter in room;with family/visitor present     Time: [id number] PT Time Calculation (min) (ACUTE ONLY): 40 min  Charges:  $Gait Training: 8-22 mins $Therapeutic Exercise: 8-22 mins $Therapeutic Activity: 8-22 mins                    G Codes:      Cristela Blue 01-11-2016, 2:04 PM  Governor Rooks, PTA pager [phone]

## 2015-12-17 DIAGNOSIS — Z7982 Long term (current) use of aspirin: Secondary | ICD-10-CM | POA: Diagnosis not present

## 2015-12-17 DIAGNOSIS — S72002D Fracture of unspecified part of neck of left femur, subsequent encounter for closed fracture with routine healing: Secondary | ICD-10-CM | POA: Diagnosis not present

## 2015-12-17 DIAGNOSIS — R269 Unspecified abnormalities of gait and mobility: Secondary | ICD-10-CM | POA: Diagnosis not present

## 2015-12-18 DIAGNOSIS — R269 Unspecified abnormalities of gait and mobility: Secondary | ICD-10-CM | POA: Diagnosis not present

## 2015-12-18 DIAGNOSIS — Z7982 Long term (current) use of aspirin: Secondary | ICD-10-CM | POA: Diagnosis not present

## 2015-12-18 DIAGNOSIS — S72002D Fracture of unspecified part of neck of left femur, subsequent encounter for closed fracture with routine healing: Secondary | ICD-10-CM | POA: Diagnosis not present

## 2015-12-21 DIAGNOSIS — S72002D Fracture of unspecified part of neck of left femur, subsequent encounter for closed fracture with routine healing: Secondary | ICD-10-CM | POA: Diagnosis not present

## 2015-12-21 DIAGNOSIS — R269 Unspecified abnormalities of gait and mobility: Secondary | ICD-10-CM | POA: Diagnosis not present

## 2015-12-21 DIAGNOSIS — Z7982 Long term (current) use of aspirin: Secondary | ICD-10-CM | POA: Diagnosis not present

## 2015-12-21 DIAGNOSIS — S72002A Fracture of unspecified part of neck of left femur, initial encounter for closed fracture: Secondary | ICD-10-CM | POA: Diagnosis not present

## 2015-12-22 DIAGNOSIS — S72012A Unspecified intracapsular fracture of left femur, initial encounter for closed fracture: Secondary | ICD-10-CM | POA: Diagnosis not present

## 2015-12-23 DIAGNOSIS — Z7982 Long term (current) use of aspirin: Secondary | ICD-10-CM | POA: Diagnosis not present

## 2015-12-23 DIAGNOSIS — R269 Unspecified abnormalities of gait and mobility: Secondary | ICD-10-CM | POA: Diagnosis not present

## 2015-12-23 DIAGNOSIS — S72002D Fracture of unspecified part of neck of left femur, subsequent encounter for closed fracture with routine healing: Secondary | ICD-10-CM | POA: Diagnosis not present

## 2015-12-24 DIAGNOSIS — S72002D Fracture of unspecified part of neck of left femur, subsequent encounter for closed fracture with routine healing: Secondary | ICD-10-CM | POA: Diagnosis not present

## 2015-12-24 DIAGNOSIS — R269 Unspecified abnormalities of gait and mobility: Secondary | ICD-10-CM | POA: Diagnosis not present

## 2015-12-24 DIAGNOSIS — Z7982 Long term (current) use of aspirin: Secondary | ICD-10-CM | POA: Diagnosis not present

## 2015-12-28 ENCOUNTER — Encounter (INDEPENDENT_AMBULATORY_CARE_PROVIDER_SITE_OTHER): Payer: Self-pay | Admitting: Orthopedic Surgery

## 2015-12-28 ENCOUNTER — Ambulatory Visit (INDEPENDENT_AMBULATORY_CARE_PROVIDER_SITE_OTHER): Payer: Self-pay

## 2015-12-28 ENCOUNTER — Ambulatory Visit (INDEPENDENT_AMBULATORY_CARE_PROVIDER_SITE_OTHER): Payer: Medicare Other | Admitting: Orthopedic Surgery

## 2015-12-28 VITALS — Ht 63.0 in | Wt 150.0 lb

## 2015-12-28 DIAGNOSIS — R269 Unspecified abnormalities of gait and mobility: Secondary | ICD-10-CM | POA: Diagnosis not present

## 2015-12-28 DIAGNOSIS — S72002D Fracture of unspecified part of neck of left femur, subsequent encounter for closed fracture with routine healing: Secondary | ICD-10-CM | POA: Diagnosis not present

## 2015-12-28 DIAGNOSIS — S72002A Fracture of unspecified part of neck of left femur, initial encounter for closed fracture: Secondary | ICD-10-CM | POA: Diagnosis not present

## 2015-12-28 DIAGNOSIS — Z7982 Long term (current) use of aspirin: Secondary | ICD-10-CM | POA: Diagnosis not present

## 2015-12-28 NOTE — Progress Notes (Signed)
   Office Visit Note   Patient: Julia Blair           Date of Birth: [birth year]           MRN: [id number] Visit Date: 12/28/2015              Requested by: [facility] [place], [place] [address] PCP: [place]   Assessment & Plan: Visit Diagnoses: No diagnosis found.  Plan: weightbearing as tolerated no restrictions follow-up as needed.  Follow-Up Instructions: No Follow-up on file.   Orders:  No orders of the defined types were placed in this encounter.  No orders of the defined types were placed in this encounter.     Procedures: No procedures performed   Clinical Data: No additional findings.   Subjective: Chief Complaint  Patient presents with  . Left Hip - Fracture, Routine Post Op  . Follow-up    Left hemiarthroplasty femoral neck fracture on 12/14/2015    Patient presents in the office today for follow up of left hemiarthroplasty of femoral neck fracture, she is two weeks post op. She is currently ambulating with a cane. Patient is at home now, she was being seen by home health physical therapy two times a week with [facility]. Home health nursing was also assisting with surgical incision checks and have no complaints to report. She has been discharged from home health as of this morning. The therapist would like to know if outpatient therapy is recommended. Patient is currently taking Norco 5/325mg  and tylenol 500mg  capsules she takes in the interim. She does have stiffness, especially with prolonged sitting, but feels ROM is slowly improving.    Review of Systems   Objective: Vital Signs: There were no vitals taken for this visit.  Physical Examon examination patient's incision is healing nicely we'll harvest the staples today. There is no cellulitis no drainage no signs of infection.  Ortho Exam  Specialty Comments:  No specialty comments available.  Imaging: No results found.   PMFS History: Patient Active  Problem List   Diagnosis Date Noted  . Unsteadiness on feet   . HLD (hyperlipidemia) 12/13/2015  . Fall 12/13/2015  . Leukocytopenia 12/13/2015  . Closed left hip fracture, initial encounter ([place]) 12/13/2015   Past Medical History:  Diagnosis Date  . High cholesterol     Family History  Problem Relation Age of Onset  . Asthma Mother   . Throat cancer Father   . Lung cancer Brother     Past Surgical History:  Procedure Laterality Date  . ABDOMINAL HYSTERECTOMY    . HIP ARTHROPLASTY Left 12/14/2015   Procedure: ARTHROPLASTY BIPOLAR HIP (HEMIARTHROPLASTY);  Surgeon: Newt Minion, MD;  Location: [place];  Service: Orthopedics;  Laterality: Left;   Social History   Occupational History  . Not on file.   Social History Main Topics  . Smoking status: Never Smoker  . Smokeless tobacco: Not on file  . Alcohol use Yes     Comment: Social drink  . Drug use: No  . Sexual activity: Not on file

## 2016-01-15 DIAGNOSIS — S72002A Fracture of unspecified part of neck of left femur, initial encounter for closed fracture: Secondary | ICD-10-CM | POA: Diagnosis not present

## 2016-01-20 DIAGNOSIS — E7800 Pure hypercholesterolemia, unspecified: Secondary | ICD-10-CM | POA: Diagnosis not present

## 2016-01-20 DIAGNOSIS — E785 Hyperlipidemia, unspecified: Secondary | ICD-10-CM | POA: Diagnosis not present

## 2016-01-20 DIAGNOSIS — I10 Essential (primary) hypertension: Secondary | ICD-10-CM | POA: Diagnosis not present

## 2016-01-22 DIAGNOSIS — E785 Hyperlipidemia, unspecified: Secondary | ICD-10-CM | POA: Diagnosis not present

## 2016-01-22 DIAGNOSIS — K5900 Constipation, unspecified: Secondary | ICD-10-CM | POA: Diagnosis not present

## 2016-01-22 DIAGNOSIS — S72002D Fracture of unspecified part of neck of left femur, subsequent encounter for closed fracture with routine healing: Secondary | ICD-10-CM | POA: Diagnosis not present

## 2016-01-22 DIAGNOSIS — M545 Low back pain: Secondary | ICD-10-CM | POA: Diagnosis not present

## 2016-02-08 DIAGNOSIS — M899 Disorder of bone, unspecified: Secondary | ICD-10-CM | POA: Diagnosis not present

## 2016-02-08 DIAGNOSIS — Z13820 Encounter for screening for osteoporosis: Secondary | ICD-10-CM | POA: Diagnosis not present

## 2016-04-27 DIAGNOSIS — M25562 Pain in left knee: Secondary | ICD-10-CM | POA: Diagnosis not present

## 2016-04-27 DIAGNOSIS — K5900 Constipation, unspecified: Secondary | ICD-10-CM | POA: Diagnosis not present

## 2016-04-27 DIAGNOSIS — R1030 Lower abdominal pain, unspecified: Secondary | ICD-10-CM | POA: Diagnosis not present

## 2016-04-29 DIAGNOSIS — R1030 Lower abdominal pain, unspecified: Secondary | ICD-10-CM | POA: Diagnosis not present

## 2016-04-29 DIAGNOSIS — J984 Other disorders of lung: Secondary | ICD-10-CM | POA: Diagnosis not present

## 2016-04-29 DIAGNOSIS — R109 Unspecified abdominal pain: Secondary | ICD-10-CM | POA: Diagnosis not present

## 2016-04-29 DIAGNOSIS — I700 Atherosclerosis of aorta: Secondary | ICD-10-CM | POA: Diagnosis not present

## 2016-05-02 DIAGNOSIS — D369 Benign neoplasm, unspecified site: Secondary | ICD-10-CM | POA: Diagnosis not present

## 2016-05-02 DIAGNOSIS — L570 Actinic keratosis: Secondary | ICD-10-CM | POA: Diagnosis not present

## 2016-05-13 DIAGNOSIS — Z1389 Encounter for screening for other disorder: Secondary | ICD-10-CM | POA: Diagnosis not present

## 2016-05-13 DIAGNOSIS — R1031 Right lower quadrant pain: Secondary | ICD-10-CM | POA: Diagnosis not present

## 2016-05-23 ENCOUNTER — Encounter (INDEPENDENT_AMBULATORY_CARE_PROVIDER_SITE_OTHER): Payer: Self-pay | Admitting: Internal Medicine

## 2016-05-23 ENCOUNTER — Encounter (INDEPENDENT_AMBULATORY_CARE_PROVIDER_SITE_OTHER): Payer: Self-pay

## 2016-06-01 ENCOUNTER — Ambulatory Visit (INDEPENDENT_AMBULATORY_CARE_PROVIDER_SITE_OTHER): Payer: Medicare Other | Admitting: Internal Medicine

## 2016-06-01 ENCOUNTER — Encounter (INDEPENDENT_AMBULATORY_CARE_PROVIDER_SITE_OTHER): Payer: Self-pay | Admitting: Internal Medicine

## 2016-06-01 VITALS — BP 140/68 | HR 72 | Temp 98.3°F | Ht 63.0 in | Wt 151.4 lb

## 2016-06-01 DIAGNOSIS — K5909 Other constipation: Secondary | ICD-10-CM | POA: Diagnosis not present

## 2016-06-01 DIAGNOSIS — R1031 Right lower quadrant pain: Secondary | ICD-10-CM | POA: Diagnosis not present

## 2016-06-01 LAB — CBC WITH DIFFERENTIAL/PLATELET
Basophils Absolute: 79 cells/uL (ref 0–200)
Basophils Relative: 1 %
EOS PCT: 1 %
Eosinophils Absolute: 79 cells/uL (ref 15–500)
HEMATOCRIT: 39.4 % (ref 35.0–45.0)
HEMOGLOBIN: 12.8 g/dL (ref 11.7–15.5)
LYMPHS ABS: 1738 {cells}/uL (ref 850–3900)
Lymphocytes Relative: 22 %
MCH: 28.4 pg (ref 27.0–33.0)
MCHC: 32.5 g/dL (ref 32.0–36.0)
MCV: 87.6 fL (ref 80.0–100.0)
MONO ABS: 632 {cells}/uL (ref 200–950)
MPV: 10.1 fL (ref 7.5–12.5)
Monocytes Relative: 8 %
NEUTROS ABS: 5372 {cells}/uL (ref 1500–7800)
Neutrophils Relative %: 68 %
Platelets: 286 10*3/uL (ref 140–400)
RBC: 4.5 MIL/uL (ref 3.80–5.10)
RDW: 14.3 % (ref 11.0–15.0)
WBC: 7.9 10*3/uL (ref 3.8–10.8)

## 2016-06-01 LAB — HEPATIC FUNCTION PANEL
ALK PHOS: 52 U/L (ref 33–130)
ALT: 8 U/L (ref 6–29)
AST: 12 U/L (ref 10–35)
Albumin: 4.1 g/dL (ref 3.6–5.1)
BILIRUBIN DIRECT: 0.1 mg/dL (ref <=0.2)
BILIRUBIN INDIRECT: 0.4 mg/dL (ref 0.2–1.2)
BILIRUBIN TOTAL: 0.5 mg/dL (ref 0.2–1.2)
Total Protein: 7 g/dL (ref 6.1–8.1)

## 2016-06-01 MED ORDER — LUBIPROSTONE 8 MCG PO CAPS: 8.0000 ug | ORAL_CAPSULE | Freq: Two times a day (BID) | ORAL | 3 refills | Status: DC

## 2016-06-01 NOTE — Patient Instructions (Signed)
CBC, Hepatic. Samples of Amitiza 8mg  BID Rx sent to her pharmacy.

## 2016-06-01 NOTE — Progress Notes (Addendum)
   Subjective:    Patient ID: Julia Blair, female    DOB: 08-27-1945, 71 y.o.   MRN: [id number]  HPI  Referred by Dr. Quillian Quince for RLQ pain. She tells me she has a pain RLQ pain for about a year. The pain comes and goes. When she sits up straight, the movement agitates it.  The pain will last sometimes all day and some days it will last a few minutes. No nausea or vomiting. Pain is worse with movement. She thinks constipation aggravates.  No fever. She has a BM every other day when she takes the Mirralax. After she has a BM, sometimes the pain get better, however today she says she had a BM this am and the pain seems to be worse. Rates pain at a 6. No melena or  BRRB.  Her last colonoscopy was in 2013 by Dr. Britta Mccreedy. 6 polyps. Biopsy: Hyperplastic and 2 were tubular adenomas.    Left hip replacement in October of 2017/   04/29/2016 CT abdomen/pelvis with CM: abdominal pain (lower).  No acute abnormality seen within the abdomen or pelvis.   Review of Systems Past Medical History:  Diagnosis Date  . High cholesterol     Past Surgical History:  Procedure Laterality Date  . ABDOMINAL HYSTERECTOMY    . HIP ARTHROPLASTY Left 12/14/2015   Procedure: ARTHROPLASTY BIPOLAR HIP (HEMIARTHROPLASTY);  Surgeon: Newt Minion, MD;  Location: [place];  Service: Orthopedics;  Laterality: Left;    Allergies  Allergen Reactions  . Naproxen     Headache    Current Outpatient Prescriptions on File Prior to Visit  Medication Sig Dispense Refill  . Coenzyme Q10 (CO Q 10) 100 MG CAPS Take 100 mg by mouth daily.    Marland Kitchen HYDROcodone-acetaminophen (NORCO/VICODIN) 5-325 MG tablet Take 1 tablet by mouth every 6 (six) hours as needed for moderate pain. 20 tablet 0  . Multiple Vitamin (MULTIVITAMIN) tablet Take 1 tablet by mouth daily.    . Red Yeast Rice 600 MG CAPS Take 1,200 mg by mouth daily.     No current facility-administered medications on file prior to visit.        Objective:   Physical Exam Blood  pressure 140/68, pulse 72, temperature 98.3 F (36.8 C), height 5\' 3"  (1.6 m), weight 151 lb 6.4 oz (68.7 kg).  Alert and oriented. Skin warm and dry. Oral mucosa is moist.   . Sclera anicteric, conjunctivae is pink. Thyroid not enlarged. No cervical lymphadenopathy. Lungs clear. Heart regular rate and rhythm.  Abdomen is soft. Bowel sounds are positive. No hepatomegaly. No abdominal masses felt. No abdominal tenderness.  tenderness.  No edema to lower extremities. Tenderness lateral hip area.       Assessment & Plan:  RLQ pain.  Am going to get blood work. (CBC, Hepatic) Am going to try her on Amitiza BID  Samples x 4 boxes given to patient.  OV in 3 months.

## 2016-06-14 DIAGNOSIS — I10 Essential (primary) hypertension: Secondary | ICD-10-CM | POA: Diagnosis not present

## 2016-06-14 DIAGNOSIS — E7800 Pure hypercholesterolemia, unspecified: Secondary | ICD-10-CM | POA: Diagnosis not present

## 2016-06-14 DIAGNOSIS — E785 Hyperlipidemia, unspecified: Secondary | ICD-10-CM | POA: Diagnosis not present

## 2016-06-16 DIAGNOSIS — E785 Hyperlipidemia, unspecified: Secondary | ICD-10-CM | POA: Diagnosis not present

## 2016-06-16 DIAGNOSIS — S72002S Fracture of unspecified part of neck of left femur, sequela: Secondary | ICD-10-CM | POA: Diagnosis not present

## 2016-06-16 DIAGNOSIS — K5900 Constipation, unspecified: Secondary | ICD-10-CM | POA: Diagnosis not present

## 2016-06-16 DIAGNOSIS — M545 Low back pain: Secondary | ICD-10-CM | POA: Diagnosis not present

## 2016-06-16 DIAGNOSIS — M25552 Pain in left hip: Secondary | ICD-10-CM | POA: Diagnosis not present

## 2016-06-24 ENCOUNTER — Ambulatory Visit (INDEPENDENT_AMBULATORY_CARE_PROVIDER_SITE_OTHER): Payer: Medicare Other | Admitting: Orthopedic Surgery

## 2016-06-27 ENCOUNTER — Ambulatory Visit (INDEPENDENT_AMBULATORY_CARE_PROVIDER_SITE_OTHER): Payer: Medicare Other

## 2016-06-27 ENCOUNTER — Ambulatory Visit (INDEPENDENT_AMBULATORY_CARE_PROVIDER_SITE_OTHER): Payer: Medicare Other | Admitting: Family

## 2016-06-27 ENCOUNTER — Encounter (INDEPENDENT_AMBULATORY_CARE_PROVIDER_SITE_OTHER): Payer: Self-pay | Admitting: Orthopedic Surgery

## 2016-06-27 VITALS — Ht 63.0 in | Wt 151.0 lb

## 2016-06-27 DIAGNOSIS — M25552 Pain in left hip: Secondary | ICD-10-CM | POA: Diagnosis not present

## 2016-06-27 DIAGNOSIS — M7632 Iliotibial band syndrome, left leg: Secondary | ICD-10-CM

## 2016-06-27 NOTE — Progress Notes (Signed)
Office Visit Note   Patient: Julia Blair           Date of Birth: 1945-11-28           MRN: [id number] Visit Date: 06/27/2016              Requested by: [facility] [place], [place] [address] PCP: [place]  Chief Complaint  Patient presents with  . Left Hip - Pain    s/p left hip hemiarthroplasty 12/14/15      HPI: The patient is a 71 year old woman who presents today complaining of left hip pain since last October. She is status post hemiarthroplasty on the left. Complains that she has lateral hip pain that radiates anteriorly this occasionally and is associated with knee pain as well. Complains of difficulty with walking due to pain.Colon [place] if she has expectations that are just too high".   Assessment & Plan: Visit Diagnoses:  1. Pain in left hip   2. It band syndrome, left     Plan: Cortisone injection left hip. Advised Ibu or Aleve prn. followup in 4 weeks as needed.  Follow-Up Instructions: Return in about 4 weeks (around 07/25/2016).   Left Hip Exam   Tenderness  The patient is experiencing tenderness in the greater trochanter (lateral femur, fibular head).  Range of Motion  The patient has normal left hip ROM.  Comments:  Pain with internal rotation      Patient is alert, oriented, no adenopathy, well-dressed, normal affect, normal respiratory effort.   Imaging: Xr Hip Unilat W Or W/o Pelvis 2-3 Views Left  Result Date: 06/27/2016 Radiographs of the left hip show stable alignment. No hardware failure. No complicating features.   Labs: No results found for: HGBA1C, ESRSEDRATE, CRP, LABURIC, REPTSTATUS, GRAMSTAIN, CULT, LABORGA  Orders:  Orders Placed This Encounter  Procedures  . XR HIP UNILAT W OR W/O PELVIS 2-3 VIEWS LEFT   No orders of the defined types were placed in this encounter.    Procedures: Large Joint Inj Date/Time: 06/27/2016 4:55 PM Performed by: Suzan Slick Authorized by: Dondra Prader R   Consent Given by:  Patient Site marked: the procedure site was marked   Timeout: prior to procedure the correct patient, procedure, and site was verified   Indications:  Pain and diagnostic evaluation Location:  Hip Site:  L greater trochanter Prep: patient was prepped and draped in usual sterile fashion   Needle Size:  22 G Needle Length:  1.5 inches Ultrasound Guidance: No   Fluoroscopic Guidance: No   Arthrogram: No   Medications:  5 mL lidocaine 1 %; 40 mg methylPREDNISolone acetate 40 MG/ML Aspiration Attempted: No   Patient tolerance:  Patient tolerated the procedure well with no immediate complications     Clinical Data: No additional findings.  ROS:  All other systems negative, except as noted in the HPI. Review of Systems  Constitutional: Negative for chills and fever.  Cardiovascular: Negative for leg swelling.  Musculoskeletal: Positive for arthralgias and gait problem.    Objective: Vital Signs: Ht 5\' 3"  (1.6 m)   Wt 151 lb (68.5 kg)   BMI 26.75 kg/m   Specialty Comments:  No specialty comments available.  PMFS History: Patient Active Problem List   Diagnosis Date Noted  . Unsteadiness on feet   . HLD (hyperlipidemia) 12/13/2015  . Fall 12/13/2015  . Leukocytopenia 12/13/2015  . Closed left hip fracture, initial encounter ([place]) 12/13/2015   Past  Medical History:  Diagnosis Date  . High cholesterol     Family History  Problem Relation Age of Onset  . Asthma Mother   . Throat cancer Father   . Lung cancer Brother     Past Surgical History:  Procedure Laterality Date  . ABDOMINAL HYSTERECTOMY    . HIP ARTHROPLASTY Left 12/14/2015   Procedure: ARTHROPLASTY BIPOLAR HIP (HEMIARTHROPLASTY);  Surgeon: Newt Minion, MD;  Location: [facility];  Service: Orthopedics;  Laterality: Left;   Social History   Occupational History  . Not on file.   Social History Main Topics  . Smoking status: Never Smoker  . Smokeless tobacco: Never Used  .  Alcohol use Yes     Comment: Social drink  . Drug use: No  . Sexual activity: Not on file

## 2016-06-29 MED ORDER — METHYLPREDNISOLONE ACETATE 40 MG/ML IJ SUSP
40.0000 mg | INTRAMUSCULAR | Status: AC | PRN
  Administered 2016-06-27: 40 mg via INTRA_ARTICULAR

## 2016-06-29 MED ORDER — LIDOCAINE HCL 1 % IJ SOLN
5.0000 mL | INTRAMUSCULAR | Status: AC | PRN
  Administered 2016-06-27: 5 mL

## 2016-07-15 ENCOUNTER — Encounter (INDEPENDENT_AMBULATORY_CARE_PROVIDER_SITE_OTHER): Payer: Self-pay

## 2016-07-20 ENCOUNTER — Encounter (INDEPENDENT_AMBULATORY_CARE_PROVIDER_SITE_OTHER): Payer: Self-pay

## 2016-07-25 ENCOUNTER — Encounter (INDEPENDENT_AMBULATORY_CARE_PROVIDER_SITE_OTHER): Payer: Self-pay | Admitting: Orthopedic Surgery

## 2016-07-25 ENCOUNTER — Ambulatory Visit (INDEPENDENT_AMBULATORY_CARE_PROVIDER_SITE_OTHER): Payer: Medicare Other | Admitting: Orthopedic Surgery

## 2016-07-25 VITALS — Ht 63.0 in | Wt 151.0 lb

## 2016-07-25 DIAGNOSIS — M25552 Pain in left hip: Secondary | ICD-10-CM | POA: Diagnosis not present

## 2016-07-25 NOTE — Progress Notes (Signed)
Office Visit Note   Patient: Julia Blair           Date of Birth: 08-28-1945           MRN: [id number] Visit Date: 07/25/2016              Requested by: Practice, [address] [place], [place] [address] PCP: System, Pcp Not In  Chief Complaint  Patient presents with  . Left Hip - Pain, Follow-up    S/p left hip hemi 12/14/15 and injection 06/27/16      HPI: Patient is about 7 months status post left hip hemiarthroplasty for a femoral neck fracture. Patient has been having increasing pain in the groin and lateral aspect of her left hip. She states she has pain with sitting pain with walking. Denies radicular pain down her legs she states she occasionally has some back pain which is relieved with a TENS unit.  Assessment & Plan: Visit Diagnoses:  1. Pain in left hip     Plan: We'll have patient see Dr. Ninfa [place] for evaluation of conversion of hemiarthroplasty to a total hip arthroplasty.  Follow-Up Instructions: Return in about 1 week (around 08/01/2016).   Ortho Exam  Patient is alert, oriented, no adenopathy, well-dressed, normal affect, normal respiratory effort. Examination patient does have an antalgic gait she does have an abductor lurch and swings her body over the left hip with ambulation. She has no pain with passive range of motion of the hip. Radiographs are reviewed which shows no articular cartilage in the acetabulum. She has a good joint space on the right. No complicating features of the femoral stem. Patient's CT scan was also reviewed from February 2018 and I did not see any evidence of any herniated disc or significant stenosis.  Imaging: No results found.  Labs: No results found for: HGBA1C, ESRSEDRATE, CRP, LABURIC, REPTSTATUS, GRAMSTAIN, CULT, LABORGA  Orders:  No orders of the defined types were placed in this encounter.  No orders of the defined types were placed in this encounter.    Procedures: No procedures performed  Clinical  Data: No additional findings.  ROS:  All other systems negative, except as noted in the HPI. Review of Systems  Objective: Vital Signs: Ht 5\' 3"  (1.6 m)   Wt 151 lb (68.5 kg)   BMI 26.75 kg/m   Specialty Comments:  No specialty comments available.  PMFS History: Patient Active Problem List   Diagnosis Date Noted  . Pain in left hip 07/25/2016  . Unsteadiness on feet   . HLD (hyperlipidemia) 12/13/2015  . Fall 12/13/2015  . Leukocytopenia 12/13/2015  . Closed left hip fracture, initial encounter ([place]) 12/13/2015   Past Medical History:  Diagnosis Date  . High cholesterol     Family History  Problem Relation Age of Onset  . Asthma Mother   . Throat cancer Father   . Lung cancer Brother     Past Surgical History:  Procedure Laterality Date  . ABDOMINAL HYSTERECTOMY    . HIP ARTHROPLASTY Left 12/14/2015   Procedure: ARTHROPLASTY BIPOLAR HIP (HEMIARTHROPLASTY);  Surgeon: Newt Minion, MD;  Location: [facility];  Service: Orthopedics;  Laterality: Left;   Social History   Occupational History  . Not on file.   Social History Main Topics  . Smoking status: Never Smoker  . Smokeless tobacco: Never Used  . Alcohol use Yes     Comment: Social drink  . Drug use: No  . Sexual activity: Not  on file

## 2016-08-22 ENCOUNTER — Ambulatory Visit (INDEPENDENT_AMBULATORY_CARE_PROVIDER_SITE_OTHER): Payer: Medicare Other | Admitting: Orthopaedic Surgery

## 2016-08-22 DIAGNOSIS — M25552 Pain in left hip: Secondary | ICD-10-CM

## 2016-08-22 DIAGNOSIS — M7062 Trochanteric bursitis, left hip: Secondary | ICD-10-CM

## 2016-08-22 MED ORDER — LIDOCAINE HCL 1 % IJ SOLN
3.0000 mL | INTRAMUSCULAR | Status: AC | PRN
  Administered 2016-08-22: 3 mL

## 2016-08-22 MED ORDER — METHYLPREDNISOLONE ACETATE 40 MG/ML IJ SUSP
40.0000 mg | INTRAMUSCULAR | Status: AC | PRN
  Administered 2016-08-22: 40 mg via INTRA_ARTICULAR

## 2016-08-22 NOTE — Progress Notes (Signed)
Office Visit Note   Patient: Julia Blair           Date of Birth: Oct 22, 1945           MRN: [id number] Visit Date: 08/22/2016              Requested by: No referring provider defined for this encounter. PCP: System, Pcp Not In   Assessment & Plan: Visit Diagnoses: No diagnosis found.  Plan: This is certainly a difficult situation. I'm not convinced that she needs a total hip replacement but she needs to understand that if we converted this to a total hip that we would assess the femoral component as well to see if that has any evidence of loosening. I did have her lay supine and had her demonstrate back to me stretching exercises focused on the greater trochanteric area. She says right now she roller I have any surgery but she has on a daily with this type of pain. I talked her about May trying my own trochanteric injection she is agreeable to this. The risk and benefits of injections and provided a steroid injection in her point of maximum tenderness over the trochanteric area of her left hip. I would like see her back in 2 weeks to see how she doing overall after these exercises and the injection. Again our only other option will be converting this to a total hip replacement I will make sure that this is truly what her symptoms are and our surgery would help the symptoms. She understands that fully and all questions were encouraged and answered.  Follow-Up Instructions: Return in about 2 weeks (around 09/05/2016).   Orders:  No orders of the defined types were placed in this encounter.  No orders of the defined types were placed in this encounter.     Procedures: Large Joint Inj Date/Time: 08/22/2016 11:20 AM Performed by: Mcarthur Rossetti Authorized by: Mcarthur Rossetti   Location:  Hip Site:  L greater trochanter Ultrasound Guidance: No   Fluoroscopic Guidance: No   Arthrogram: No   Medications:  3 mL lidocaine 1 %; 40 mg methylPREDNISolone acetate 40  MG/ML     Clinical Data: No additional findings.   Subjective: No chief complaint on file. The patient is a very pleasant 71 year old patient who had a left hip hemiarthroplasty this past October after sustaining a left femoral neck fracture. She says she is had pain since the early onset postoperative and is not gone away. She points the trochanteric areas source for pain but is also been issued him in a once in the groin. She's had 1 trochanteric injection should this not help. She does not walk with any assistive device and she is a thin individual. The pain is daily and is detrimentally affected her activities daily living, her quality of life, and her mobility. She's had some mild knee pain and no significant back issues  HPI  Review of Systems She denies any headache, chest pain, fever, chills, nausea, vomiting, shortness of breath  Objective: Vital Signs: There were no vitals taken for this visit.  Physical Exam She is alert and 3 and in no acute distress Ortho Exam She does walk with a limp. When I had her lay supine on the exam table I can easily flex and extend her hip I can put her through internal/external rotation as well as no pain in the groin today she's had some pain before there had her lay on her side we inspect  her incision. Her incision looks well-healed. She has severe pain over the trochanteric area just posterior and distal to the incision. She is quite tender in this area. Her knee exam is normal today with good range of motion. I did independently review past x-rays of the hip and shows a well-seated femoral component and a unipolar hip ball. There is no significant preoperative or postoperative changes in the hip and groin to suggest breakdown. Specialty Comments:  No specialty comments available.  Imaging: No results found. See above note for my independent review of her x-rays  PMFS History: Patient Active Problem List   Diagnosis Date Noted  . Pain in  left hip 07/25/2016  . Unsteadiness on feet   . HLD (hyperlipidemia) 12/13/2015  . Fall 12/13/2015  . Leukocytopenia 12/13/2015  . Closed left hip fracture, initial encounter ([place]) 12/13/2015   Past Medical History:  Diagnosis Date  . High cholesterol     Family History  Problem Relation Age of Onset  . Asthma Mother   . Throat cancer Father   . Lung cancer Brother     Past Surgical History:  Procedure Laterality Date  . ABDOMINAL HYSTERECTOMY    . HIP ARTHROPLASTY Left 12/14/2015   Procedure: ARTHROPLASTY BIPOLAR HIP (HEMIARTHROPLASTY);  Surgeon: Newt Minion, MD;  Location: [facility];  Service: Orthopedics;  Laterality: Left;   Social History   Occupational History  . Not on file.   Social History Main Topics  . Smoking status: Never Smoker  . Smokeless tobacco: Never Used  . Alcohol use Yes     Comment: Social drink  . Drug use: No  . Sexual activity: Not on file

## 2016-09-01 ENCOUNTER — Ambulatory Visit (INDEPENDENT_AMBULATORY_CARE_PROVIDER_SITE_OTHER): Payer: Medicare Other | Admitting: Internal Medicine

## 2016-09-01 ENCOUNTER — Encounter (INDEPENDENT_AMBULATORY_CARE_PROVIDER_SITE_OTHER): Payer: Self-pay | Admitting: Internal Medicine

## 2016-09-01 VITALS — BP 130/80 | HR 64 | Temp 97.2°F | Ht 63.0 in | Wt 151.5 lb

## 2016-09-01 DIAGNOSIS — K5909 Other constipation: Secondary | ICD-10-CM

## 2016-09-01 MED ORDER — LUBIPROSTONE 24 MCG PO CAPS: 24.0000 ug | ORAL_CAPSULE | Freq: Two times a day (BID) | ORAL | 3 refills | Status: DC

## 2016-09-01 NOTE — Patient Instructions (Signed)
Samples of Amitiza 4mcg given to patient. OV in 6 months.

## 2016-09-01 NOTE — Progress Notes (Signed)
   Subjective:    Patient ID: Julia Blair, female    DOB: 10-Aug-1945, 71 y.o.   MRN: [id number]  HPI Here today for f/u. Last seen in March with rt lower abdominal pain. Has had pain for a little over a year. CT scan in February was normal.  She tells me today she has 3 times a weeks. She tells me the Amitiza is helping.  She says she has the rt lower quadrant pain when she is constipated. She says she rarely has the rt lower quadrant pain now. Her appetite has remained good. No weight loss. No melena or BRRB. She is trying to exercise. She is followed by Dr. Ninfa [place] for her left hip.   Her last colonoscopy was in 2013 by Dr. Britta Mccreedy. 6 polyps. Biopsy: Hyperplastic and 2 were tubular adenomas.   04/29/2016 CT abdomen/pelvis with CM: abdominal pain (lower).  No acute abnormality seen within the abdomen or pelvis.    Hepatic Function Latest Ref Rng & Units 06/01/2016  Total Protein 6.1 - 8.1 g/dL 7.0  Albumin 3.6 - 5.1 g/dL 4.1  AST 10 - 35 U/L 12  ALT 6 - 29 U/L 8  Alk Phosphatase 33 - 130 U/L 52  Total Bilirubin 0.2 - 1.2 mg/dL 0.5  Bilirubin, Direct <=0.2 mg/dL 0.1   CBC    Component Value Date/Time   WBC 7.9 06/01/2016 1039   RBC 4.50 06/01/2016 1039   HGB 12.8 06/01/2016 1039   HCT 39.4 06/01/2016 1039   PLT 286 06/01/2016 1039   MCV 87.6 06/01/2016 1039   MCH 28.4 06/01/2016 1039   MCHC 32.5 06/01/2016 1039   RDW 14.3 06/01/2016 1039   LYMPHSABS 1,738 06/01/2016 1039   MONOABS 632 06/01/2016 1039   EOSABS 79 06/01/2016 1039   BASOSABS 79 06/01/2016 1039     Review of Systems     Objective:   Physical Exam Blood pressure 130/80, pulse 64, temperature 97.2 F (36.2 C), height '5\' 3"'$  (1.6 m), weight 151 lb 8 oz (68.7 kg). Alert and oriented. Skin warm and dry. Oral mucosa is moist.   . Sclera anicteric, conjunctivae is pink. Thyroid not enlarged. No cervical lymphadenopathy. Lungs clear. Heart regular rate and rhythm.  Abdomen is soft. Bowel sounds are positive. No  hepatomegaly. No abdominal masses felt. No tenderness.  No edema to lower extremities.  .         Assessment & Plan:  Rt lower quadrant related to her constipation. She says she rarely has pain at this time. Will increase her Amitiza to 57mg BID. Will give her samples x 8 boxes.  OV in 6 months.

## 2016-09-05 ENCOUNTER — Ambulatory Visit (INDEPENDENT_AMBULATORY_CARE_PROVIDER_SITE_OTHER): Payer: Medicare Other | Admitting: Orthopaedic Surgery

## 2016-09-05 DIAGNOSIS — M25552 Pain in left hip: Secondary | ICD-10-CM | POA: Diagnosis not present

## 2016-09-05 MED ORDER — DICLOFENAC SODIUM 1 % TD GEL: 2.0000 g | Freq: Four times a day (QID) | TRANSDERMAL | 3 refills | Status: DC

## 2016-09-05 NOTE — Progress Notes (Signed)
The patient is following up after a left hip trochanteric injection. She has a history of the left hip hemiarthroplasty that was done by one of our partners. There was concern that she may need this converted to a total hip arthroplasty pain and she is only 71 years old. However pain seems to be over the trochanteric area. She said the injection I provided at last visit 2 weeks ago made her pain go away completely for a week. She denies any groin pain at all. She'll a positive trochanteric areas source for pain.  On exam I can compress her hip and put her hip ball through range of motion left-sided does not hurt in the groin at all. Her pain is only over the trochanteric area and it does not seem to be severe.  I told her to give this still some more time we can always reinject this area again in 3 months. On her work on stretching exercises that shoulder as well as topical anti-inflammatories and I'll send in some Voltaren gel. In 3 months when she does, and she does not need x-rays.

## 2016-09-12 DIAGNOSIS — H40033 Anatomical narrow angle, bilateral: Secondary | ICD-10-CM | POA: Diagnosis not present

## 2016-09-12 DIAGNOSIS — H2513 Age-related nuclear cataract, bilateral: Secondary | ICD-10-CM | POA: Diagnosis not present

## 2016-10-11 DIAGNOSIS — I10 Essential (primary) hypertension: Secondary | ICD-10-CM | POA: Diagnosis not present

## 2016-10-11 DIAGNOSIS — E785 Hyperlipidemia, unspecified: Secondary | ICD-10-CM | POA: Diagnosis not present

## 2016-10-11 DIAGNOSIS — E7800 Pure hypercholesterolemia, unspecified: Secondary | ICD-10-CM | POA: Diagnosis not present

## 2016-10-14 DIAGNOSIS — S72002S Fracture of unspecified part of neck of left femur, sequela: Secondary | ICD-10-CM | POA: Diagnosis not present

## 2016-10-14 DIAGNOSIS — Z0000 Encounter for general adult medical examination without abnormal findings: Secondary | ICD-10-CM | POA: Diagnosis not present

## 2016-10-14 DIAGNOSIS — K5900 Constipation, unspecified: Secondary | ICD-10-CM | POA: Diagnosis not present

## 2016-10-14 DIAGNOSIS — E785 Hyperlipidemia, unspecified: Secondary | ICD-10-CM | POA: Diagnosis not present

## 2016-10-14 DIAGNOSIS — M545 Low back pain: Secondary | ICD-10-CM | POA: Diagnosis not present

## 2016-12-06 ENCOUNTER — Ambulatory Visit (INDEPENDENT_AMBULATORY_CARE_PROVIDER_SITE_OTHER): Payer: Medicare Other | Admitting: Orthopaedic Surgery

## 2016-12-06 DIAGNOSIS — M7062 Trochanteric bursitis, left hip: Secondary | ICD-10-CM | POA: Insufficient documentation

## 2016-12-06 DIAGNOSIS — M25552 Pain in left hip: Secondary | ICD-10-CM | POA: Diagnosis not present

## 2016-12-06 MED ORDER — NABUMETONE 500 MG PO TABS: 500.0000 mg | ORAL_TABLET | Freq: Two times a day (BID) | ORAL | 0 refills | Status: DC | PRN

## 2016-12-06 MED ORDER — DICLOFENAC SODIUM 1 % TD GEL: 2.0000 g | Freq: Four times a day (QID) | TRANSDERMAL | 3 refills | Status: DC

## 2016-12-06 NOTE — Progress Notes (Signed)
The patient is been seen before for left hip pain. One of my partners performed a left hip hemiarthroplasty on her exactly 1 year ago. She still reports hip pain on the lateral aspect of her hip but not in the groin. When I saw her last I provided a steroid injection of the trochanteric area in July of this year. She said that was great but only lasted about a week. She still denies any groin pain at all. She's not been through any physical therapy but she is receptive to this. She is of the medication helps his Excedrin which she takes 3 times in the morning.  On exam she has full range motion of her left hip no pain in the groin at all. Her pain is only over the trochanteric area and the iliotibial band. I can compress her hip and that causes no pain in the groin. Her leg lengths are near equal.  A do feel that she'll benefit from formal physical therapy. I like to try topical anti-inflammatories as well as a different oral anti-inflammatory. All questions and concerns were answered and addressed. I did not provide an injection today. I do feel the physical therapist can help so I gave her a prescription for this. We'll see her back in 6 weeks to see if this is helped.

## 2016-12-30 DIAGNOSIS — M7062 Trochanteric bursitis, left hip: Secondary | ICD-10-CM | POA: Diagnosis not present

## 2017-01-02 DIAGNOSIS — M7062 Trochanteric bursitis, left hip: Secondary | ICD-10-CM | POA: Diagnosis not present

## 2017-01-04 DIAGNOSIS — M7062 Trochanteric bursitis, left hip: Secondary | ICD-10-CM | POA: Diagnosis not present

## 2017-01-09 DIAGNOSIS — M7062 Trochanteric bursitis, left hip: Secondary | ICD-10-CM | POA: Diagnosis not present

## 2017-01-11 DIAGNOSIS — M7062 Trochanteric bursitis, left hip: Secondary | ICD-10-CM | POA: Diagnosis not present

## 2017-01-16 DIAGNOSIS — M7062 Trochanteric bursitis, left hip: Secondary | ICD-10-CM | POA: Diagnosis not present

## 2017-01-17 ENCOUNTER — Ambulatory Visit (INDEPENDENT_AMBULATORY_CARE_PROVIDER_SITE_OTHER): Payer: Medicare Other | Admitting: Orthopaedic Surgery

## 2017-01-18 DIAGNOSIS — M7062 Trochanteric bursitis, left hip: Secondary | ICD-10-CM | POA: Diagnosis not present

## 2017-01-23 DIAGNOSIS — M7062 Trochanteric bursitis, left hip: Secondary | ICD-10-CM | POA: Diagnosis not present

## 2017-01-25 DIAGNOSIS — M7062 Trochanteric bursitis, left hip: Secondary | ICD-10-CM | POA: Diagnosis not present

## 2017-01-30 ENCOUNTER — Ambulatory Visit (INDEPENDENT_AMBULATORY_CARE_PROVIDER_SITE_OTHER): Payer: Medicare Other | Admitting: Orthopaedic Surgery

## 2017-01-30 ENCOUNTER — Encounter (INDEPENDENT_AMBULATORY_CARE_PROVIDER_SITE_OTHER): Payer: Self-pay | Admitting: Orthopaedic Surgery

## 2017-01-30 DIAGNOSIS — M7062 Trochanteric bursitis, left hip: Secondary | ICD-10-CM | POA: Diagnosis not present

## 2017-01-30 MED ORDER — LIDOCAINE HCL 1 % IJ SOLN
3.0000 mL | INTRAMUSCULAR | Status: AC | PRN
  Administered 2017-01-30: 3 mL

## 2017-01-30 MED ORDER — METHYLPREDNISOLONE ACETATE 40 MG/ML IJ SUSP
40.0000 mg | INTRAMUSCULAR | Status: AC | PRN
Start: 2017-01-30 — End: 2017-01-30
  Administered 2017-01-30: 40 mg via INTRA_ARTICULAR

## 2017-01-30 NOTE — Progress Notes (Signed)
Office Visit Note   Patient: Julia Blair           Date of Birth: [birth year]           MRN: [id number] Visit Date: 01/30/2017              Requested by: Caryl Bis, MD [place], [place] [address] PCP: Caryl Bis, MD   Assessment & Plan: Visit Diagnoses:  1. Trochanteric bursitis, left hip     Plan: I spoke with her about trying one more trochanteric injection and she is agreeable to this.  This is to be on the left hip.  She understands the risks and benefits of this as well.  I will have her stop physical therapy at this point since it is not help.  We will see her back in 6 months and I would like a low AP pelvis and lateral of her left hip at that visit.  Again she had a hemiarthroplasty done by 1 of my partners in late 2017 and still denies any groin pain.  Most of this is been trochanteric related.  Hopefully she will get some resolution of his pain with time.  The patient is  Follow-Up Instructions: Return in about 6 months (around 07/30/2017).   Orders:  Orders Placed This Encounter  Procedures  . Large Joint Inj   No orders of the defined types were placed in this encounter.     Procedures: Large Joint Inj: L greater trochanter on 01/30/2017 10:20 AM Indications: pain and diagnostic evaluation Details: 22 G 1.5 in needle, lateral approach  Arthrogram: No  Medications: 3 mL lidocaine 1 %; 40 mg methylPREDNISolone acetate 40 MG/ML Outcome: tolerated well, no immediate complications Procedure, treatment alternatives, risks and benefits explained, specific risks discussed. Consent was given by the patient. Immediately prior to procedure a time out was called to verify the correct patient, procedure, equipment, support staff and site/side marked as required. Patient was prepped and draped in the usual sterile fashion.       Clinical Data: No additional findings.   Subjective: Chief Complaint  Patient presents with  . Left Hip - Follow-up    Here for continued follow-up of trochanteric pain.  She had a left hip hemiarthroplasty by 1 of my partners in October 2017.  She has had a lot of pain over the trochanteric area and I sent her for physical therapy to see if that would help.  She did physical therapy has not helped at all.  She still denies any groin pain.  HPI  Review of Systems Denies any headache, chest pain, shortness of breath, fever, chills, nausea, vomiting.  Objective: Vital Signs: There were no vitals taken for this visit.  Physical Exam She is alert and oriented x3 and in no acute distress Ortho Exam Examination of the left hip she has full range of motion of the hip.  Her pain is only of the trochanteric area. Specialty Comments:  No specialty comments available.  Imaging: No results found.   PMFS History: Patient Active Problem List   Diagnosis Date Noted  . Trochanteric bursitis, left hip 12/06/2016  . Pain in left hip 07/25/2016  . Unsteadiness on feet   . HLD (hyperlipidemia) 12/13/2015  . Fall 12/13/2015  . Leukocytopenia 12/13/2015  . Closed left hip fracture, initial encounter ([facility]) 12/13/2015   Past Medical History:  Diagnosis Date  . High cholesterol     Family History  Problem Relation Age  of Onset  . Asthma Mother   . Throat cancer Father   . Lung cancer Brother     Past Surgical History:  Procedure Laterality Date  . ABDOMINAL HYSTERECTOMY    . HIP ARTHROPLASTY Left 12/14/2015   Procedure: ARTHROPLASTY BIPOLAR HIP (HEMIARTHROPLASTY);  Surgeon: Newt Minion, MD;  Location: [facility];  Service: Orthopedics;  Laterality: Left;   Social History   Occupational History  . Not on file  Tobacco Use  . Smoking status: Never Smoker  . Smokeless tobacco: Never Used  Substance and Sexual Activity  . Alcohol use: Yes    Comment: Social drink  . Drug use: No  . Sexual activity: Not on file

## 2017-03-09 ENCOUNTER — Encounter (INDEPENDENT_AMBULATORY_CARE_PROVIDER_SITE_OTHER): Payer: Self-pay | Admitting: Internal Medicine

## 2017-03-09 ENCOUNTER — Ambulatory Visit (INDEPENDENT_AMBULATORY_CARE_PROVIDER_SITE_OTHER): Payer: Medicare Other | Admitting: Internal Medicine

## 2017-03-09 VITALS — BP 136/80 | HR 60 | Temp 98.3°F | Ht 63.0 in | Wt 150.6 lb

## 2017-03-09 DIAGNOSIS — K5909 Other constipation: Secondary | ICD-10-CM

## 2017-03-09 NOTE — Patient Instructions (Addendum)
Constipation, Adult Constipation is when a person:  Poops (has a bowel movement) fewer times in a week than normal.  Has a hard time pooping.  Has poop that is dry, hard, or bigger than normal.  Follow these instructions at home: Eating and drinking   Eat foods that have a lot of fiber, such as: ? Fresh fruits and vegetables. ? Whole grains. ? Beans.  Eat less of foods that are high in fat, low in fiber, or overly processed, such as: ? [place] fries. ? Hamburgers. ? Cookies. ? Candy. ? Soda.  Drink enough fluid to keep your pee (urine) clear or pale yellow. General instructions  Exercise regularly or as told by your doctor.  Go to the restroom when you feel like you need to poop. Do not hold it in.  Take over-the-counter and prescription medicines only as told by your doctor. These include any fiber supplements.  Do pelvic floor retraining exercises, such as: ? Doing deep breathing while relaxing your lower belly (abdomen). ? Relaxing your pelvic floor while pooping.  Watch your condition for any changes.  Keep all follow-up visits as told by your doctor. This is important. Contact a doctor if:  You have pain that gets worse.  You have a fever.  You have not pooped for 4 days.  You throw up (vomit).  You are not hungry.  You lose weight.  You are bleeding from the anus.  You have thin, pencil-like poop (stool). Get help right away if:  You have a fever, and your symptoms suddenly get worse.  You leak poop or have blood in your poop.  Your belly feels hard or bigger than normal (is bloated).  You have very bad belly pain.  You feel dizzy or you faint. This information is not intended to replace advice given to you by your health care provider. Make sure you discuss any questions you have with your health care provider. Document Released: 08/10/2007 Document Revised: 09/11/2015 Document Reviewed: 08/12/2015 Elsevier Interactive Patient Education   2018 [place].  High-Fiber Diet Fiber, also called dietary fiber, is a type of carbohydrate found in fruits, vegetables, whole grains, and beans. A high-fiber diet can have many health benefits. Your health care provider may recommend a high-fiber diet to help:  Prevent constipation. Fiber can make your bowel movements more regular.  Lower your cholesterol.  Relieve hemorrhoids, uncomplicated diverticulosis, or irritable bowel syndrome.  Prevent overeating as part of a weight-loss plan.  Prevent heart disease, type 2 diabetes, and certain cancers.  What is my plan? The recommended daily intake of fiber includes:  38 grams for men under age 62.  28 grams for men over age 21.  4 grams for women under age 40.  39 grams for women over age 37.  You can get the recommended daily intake of dietary fiber by eating a variety of fruits, vegetables, grains, and beans. Your health care provider may also recommend a fiber supplement if it is not possible to get enough fiber through your diet. OV in 1 year.   What do I need to know about a high-fiber diet?  Fiber supplements have not been widely studied for their effectiveness, so it is better to get fiber through food sources.  Always check the fiber content on thenutrition facts label of any prepackaged food. Look for foods that contain at least 5 grams of fiber per serving.  Ask your dietitian if you have questions about specific foods that are related to  your condition, especially if those foods are not listed in the following section.  Increase your daily fiber consumption gradually. Increasing your intake of dietary fiber too quickly may cause bloating, cramping, or gas.  Drink plenty of water. Water helps you to digest fiber. What foods can I eat? Grains Whole-grain breads. Multigrain cereal. Oats and oatmeal. Brown rice. Barley. Bulgur wheat. [place]. Bran muffins. Popcorn. Rye wafer crackers. Vegetables Sweet potatoes.  Spinach. Kale. Artichokes. Cabbage. Broccoli. Green peas. Carrots. Squash. Fruits Berries. Pears. Apples. Oranges. Avocados. Prunes and raisins. Dried figs. Meats and Other Protein Sources [place], kidney, pinto, and soy beans. Split peas. Lentils. Nuts and seeds. Dairy Fiber-fortified yogurt. Beverages Fiber-fortified soy milk. Fiber-fortified orange juice. Other Fiber bars. The items listed above may not be a complete list of recommended foods or beverages. Contact your dietitian for more options. What foods are not recommended? Grains White bread. Pasta made with refined flour. White rice. Vegetables Fried potatoes. Canned vegetables. Well-cooked vegetables. Fruits Fruit juice. Cooked, strained fruit. Meats and Other Protein Sources Fatty cuts of meat. Fried Sales executive or fried fish. Dairy Milk. Yogurt. Cream cheese. Sour cream. Beverages Soft drinks. Other Cakes and pastries. Butter and oils. The items listed above may not be a complete list of foods and beverages to avoid. Contact your dietitian for more information. What are some tips for including high-fiber foods in my diet?  Eat a wide variety of high-fiber foods.  Make sure that half of all grains consumed each day are whole grains.  Replace breads and cereals made from refined flour or white flour with whole-grain breads and cereals.  Replace white rice with brown rice, bulgur wheat, or millet.  Start the day with a breakfast that is high in fiber, such as a cereal that contains at least 5 grams of fiber per serving.  Use beans in place of meat in soups, salads, or pasta.  Eat high-fiber snacks, such as berries, raw vegetables, nuts, or popcorn. This information is not intended to replace advice given to you by your health care provider. Make sure you discuss any questions you have with your health care provider. Document Released: 02/21/2005 Document Revised: 07/30/2015 Document Reviewed: 08/06/2013 Elsevier  Interactive Patient Education  [employer].

## 2017-03-09 NOTE — Progress Notes (Signed)
   Subjective:    Patient ID: Julia Blair, female    DOB: 12/02/1945, 72 y.o.   MRN: [id number] PCP Dr Quillian Quince HPI Here today for f/u. Last seen in June of this year for constipation. She has stopped the Amitiza.  She tells me her constipation is alright. She tries to manage her constipation with Prune and apple juice or what ever juice she can have. She is having a BM about once a week. She takes a laxative as needed. She says the [place] worked too quick. In about 15 minutes after the Amitiza, she would have diarrhea.  Her appetite is good. No weight loss.  She denies any abdominal pain.   Her last colonoscopy was in 2013 by Dr. Britta Mccreedy. 6 polyps. Biopsy: Hyperplastic and 2 were tubular adenomas.  04/29/2016 CT abdomen/pelvis with CM: abdominal pain (lower). No acute abnormality seen within the abdomen or pelvis.    Review of Systems Past Medical History:  Diagnosis Date  . High cholesterol     Past Surgical History:  Procedure Laterality Date  . ABDOMINAL HYSTERECTOMY    . HIP ARTHROPLASTY Left 12/14/2015   Procedure: ARTHROPLASTY BIPOLAR HIP (HEMIARTHROPLASTY);  Surgeon: Newt Minion, MD;  Location: [facility];  Service: Orthopedics;  Laterality: Left;    Allergies  Allergen Reactions  . Naproxen     Headache    Current Outpatient Medications on File Prior to Visit  Medication Sig Dispense Refill  . calcium carbonate (OSCAL) 1500 (600 Ca) MG TABS tablet Take by mouth 2 (two) times daily with a meal.    . Coenzyme Q10 (CO Q 10) 100 MG CAPS Take 100 mg by mouth daily.    . diclofenac sodium (VOLTAREN) 1 % GEL Apply 2 g topically 4 (four) times daily. 100 g 3  . glucosamine-chondroitin 500-400 MG tablet Take 1 tablet by mouth 3 (three) times daily.    . Multiple Vitamin (MULTIVITAMIN) tablet Take 1 tablet by mouth daily.    . nabumetone (RELAFEN) 500 MG tablet Take 500 mg by mouth as needed.    . Red Yeast Rice 600 MG CAPS Take 1,200 mg by mouth daily.    Marland Kitchen  HYDROcodone-acetaminophen (NORCO/VICODIN) 5-325 MG tablet Take 1 tablet by mouth every 6 (six) hours as needed for moderate pain. (Patient not taking: Reported on 03/09/2017) 20 tablet 0   No current facility-administered medications on file prior to visit.         Objective:   Physical Exam Blood pressure 136/80, pulse 60, temperature 98.3 F (36.8 C), height 5\' 3"  (1.6 m), weight 150 lb 9.6 oz (68.3 kg). Alert and oriented. Skin warm and dry. Oral mucosa is moist.   . Sclera anicteric, conjunctivae is pink. Thyroid not enlarged. No cervical lymphadenopathy. Lungs clear. Heart regular rate and rhythm.  Abdomen is soft. Bowel sounds are positive. No hepatomegaly. No abdominal masses felt. No tenderness.  No edema to lower extremities.         Assessment & Plan:  Constipation. Try prune juice and increase fiber in your diet.  Patient instructions for constipation and high fiber diet.

## 2017-04-10 DIAGNOSIS — E785 Hyperlipidemia, unspecified: Secondary | ICD-10-CM | POA: Diagnosis not present

## 2017-04-10 DIAGNOSIS — I10 Essential (primary) hypertension: Secondary | ICD-10-CM | POA: Diagnosis not present

## 2017-04-10 DIAGNOSIS — E7800 Pure hypercholesterolemia, unspecified: Secondary | ICD-10-CM | POA: Diagnosis not present

## 2017-04-13 ENCOUNTER — Other Ambulatory Visit (INDEPENDENT_AMBULATORY_CARE_PROVIDER_SITE_OTHER): Payer: Self-pay | Admitting: Orthopaedic Surgery

## 2017-04-13 DIAGNOSIS — S72002S Fracture of unspecified part of neck of left femur, sequela: Secondary | ICD-10-CM | POA: Diagnosis not present

## 2017-04-13 DIAGNOSIS — E785 Hyperlipidemia, unspecified: Secondary | ICD-10-CM | POA: Diagnosis not present

## 2017-04-13 DIAGNOSIS — K5900 Constipation, unspecified: Secondary | ICD-10-CM | POA: Diagnosis not present

## 2017-04-13 DIAGNOSIS — M545 Low back pain: Secondary | ICD-10-CM | POA: Diagnosis not present

## 2017-07-04 DIAGNOSIS — R079 Chest pain, unspecified: Secondary | ICD-10-CM | POA: Diagnosis not present

## 2017-07-12 DIAGNOSIS — R079 Chest pain, unspecified: Secondary | ICD-10-CM | POA: Diagnosis not present

## 2017-08-01 ENCOUNTER — Ambulatory Visit (INDEPENDENT_AMBULATORY_CARE_PROVIDER_SITE_OTHER): Payer: Medicare Other | Admitting: Orthopaedic Surgery

## 2017-08-01 ENCOUNTER — Ambulatory Visit (INDEPENDENT_AMBULATORY_CARE_PROVIDER_SITE_OTHER): Payer: Medicare Other

## 2017-08-01 ENCOUNTER — Encounter (INDEPENDENT_AMBULATORY_CARE_PROVIDER_SITE_OTHER): Payer: Self-pay | Admitting: Orthopaedic Surgery

## 2017-08-01 DIAGNOSIS — M7062 Trochanteric bursitis, left hip: Secondary | ICD-10-CM

## 2017-08-01 DIAGNOSIS — Z96642 Presence of left artificial hip joint: Secondary | ICD-10-CM | POA: Insufficient documentation

## 2017-08-01 MED ORDER — LIDOCAINE HCL 1 % IJ SOLN
3.0000 mL | INTRAMUSCULAR | Status: AC | PRN
  Administered 2017-08-01: 3 mL

## 2017-08-01 MED ORDER — METHYLPREDNISOLONE ACETATE 40 MG/ML IJ SUSP
40.0000 mg | INTRAMUSCULAR | Status: AC | PRN
  Administered 2017-08-01: 40 mg via INTRA_ARTICULAR

## 2017-08-01 NOTE — Progress Notes (Signed)
Office Visit Note   Patient: Julia Blair           Date of Birth: Dec 01, 1945           MRN: [id number] Visit Date: 08/01/2017              Requested by: Caryl Bis, MD [place], [place] [address] PCP: Caryl Bis, MD   Assessment & Plan: Visit Diagnoses:  1. History of left hip hemiarthroplasty   2. Trochanteric bursitis, left hip     Plan: It is been 6 months since her last steroid injection and I agree with trying this again over trochanteric area.  She understands the risk and benefits of injections and tolerated this well.  We will see her back in 6 months to see if we need to try this again.  At that visit I would like an AP and lateral of the left hip only.  I do not need to see the pelvis.  Follow-Up Instructions: Return in about 6 months (around 02/01/2018).   Orders:  Orders Placed This Encounter  Procedures  . Large Joint Inj  . XR HIP UNILAT W OR W/O PELVIS 1V LEFT   No orders of the defined types were placed in this encounter.     Procedures: Large Joint Inj: L greater trochanter on 08/01/2017 10:16 AM Indications: pain and diagnostic evaluation Details: 22 G 1.5 in needle, lateral approach  Arthrogram: No  Medications: 3 mL lidocaine 1 %; 40 mg methylPREDNISolone acetate 40 MG/ML Outcome: tolerated well, no immediate complications Procedure, treatment alternatives, risks and benefits explained, specific risks discussed. Consent was given by the patient. Immediately prior to procedure a time out was called to verify the correct patient, procedure, equipment, support staff and site/side marked as required. Patient was prepped and draped in the usual sterile fashion.       Clinical Data: No additional findings.   Subjective: No chief complaint on file. The patient is here for continued follow-up of left hip pain.  She is 20 months out from a left hip hemiarthroplasty performed on all partners.  This is a for a displaced femoral neck  fracture.  She denies any groin pain her pain is only been over the trochanteric area of her left hip.  She has had at least 2 injections and one did not work and she is Artie been through physical therapy as well.  She ambulates using a cane.  She has tried Voltaren gel and is now trying some samples and of this helps.  HPI  Review of Systems She currently denies any headache, chest pain, shortness of breath, fever, chills, nausea, vomiting.  Objective: Vital Signs: There were no vitals taken for this visit.  Physical Exam She is alert and oriented x3 and in no acute distress Ortho Exam Examination of her left hip shows that he moves fluidly with internal and external rotation no groin pain at all.  Her pain is only over the trochanteric area of her left hip. Specialty Comments:  No specialty comments available.  Imaging: Xr Hip Unilat W Or W/o Pelvis 1v Left  Result Date: 08/01/2017 An AP pelvis and a lateral left hip shows hemiarthroplasty is well-seated.  There is slight lucent line at the shoulder and tip of the stem to suggest chronic loosening.  This line is sclerotic in these areas.    PMFS History: Patient Active Problem List   Diagnosis Date Noted  . History of left  hip hemiarthroplasty 08/01/2017  . Trochanteric bursitis, left hip 12/06/2016  . Pain in left hip 07/25/2016  . Unsteadiness on feet   . HLD (hyperlipidemia) 12/13/2015  . Fall 12/13/2015  . Leukocytopenia 12/13/2015  . Closed left hip fracture, initial encounter ([facility]) 12/13/2015   Past Medical History:  Diagnosis Date  . High cholesterol     Family History  Problem Relation Age of Onset  . Asthma Mother   . Throat cancer Father   . Lung cancer Brother     Past Surgical History:  Procedure Laterality Date  . ABDOMINAL HYSTERECTOMY    . HIP ARTHROPLASTY Left 12/14/2015   Procedure: ARTHROPLASTY BIPOLAR HIP (HEMIARTHROPLASTY);  Surgeon: Newt Minion, MD;  Location: [facility];  Service: Orthopedics;   Laterality: Left;   Social History   Occupational History  . Not on file  Tobacco Use  . Smoking status: Never Smoker  . Smokeless tobacco: Never Used  Substance and Sexual Activity  . Alcohol use: Yes    Comment: Social drink  . Drug use: No  . Sexual activity: Not on file

## 2017-09-04 DIAGNOSIS — M7989 Other specified soft tissue disorders: Secondary | ICD-10-CM | POA: Diagnosis not present

## 2017-09-04 DIAGNOSIS — S61213A Laceration without foreign body of left middle finger without damage to nail, initial encounter: Secondary | ICD-10-CM | POA: Diagnosis not present

## 2017-09-04 DIAGNOSIS — Z23 Encounter for immunization: Secondary | ICD-10-CM | POA: Diagnosis not present

## 2017-09-04 DIAGNOSIS — W292XXA Contact with other powered household machinery, initial encounter: Secondary | ICD-10-CM | POA: Diagnosis not present

## 2017-09-13 DIAGNOSIS — E785 Hyperlipidemia, unspecified: Secondary | ICD-10-CM | POA: Diagnosis not present

## 2017-09-13 DIAGNOSIS — I10 Essential (primary) hypertension: Secondary | ICD-10-CM | POA: Diagnosis not present

## 2017-09-13 DIAGNOSIS — M545 Low back pain: Secondary | ICD-10-CM | POA: Diagnosis not present

## 2017-09-13 DIAGNOSIS — E7800 Pure hypercholesterolemia, unspecified: Secondary | ICD-10-CM | POA: Diagnosis not present

## 2017-09-14 DIAGNOSIS — K5900 Constipation, unspecified: Secondary | ICD-10-CM | POA: Diagnosis not present

## 2017-09-14 DIAGNOSIS — S61412A Laceration without foreign body of left hand, initial encounter: Secondary | ICD-10-CM | POA: Diagnosis not present

## 2017-09-14 DIAGNOSIS — M545 Low back pain: Secondary | ICD-10-CM | POA: Diagnosis not present

## 2017-09-14 DIAGNOSIS — E785 Hyperlipidemia, unspecified: Secondary | ICD-10-CM | POA: Diagnosis not present

## 2018-01-30 ENCOUNTER — Ambulatory Visit (INDEPENDENT_AMBULATORY_CARE_PROVIDER_SITE_OTHER): Payer: Medicare Other | Admitting: Orthopaedic Surgery

## 2018-01-30 ENCOUNTER — Other Ambulatory Visit (INDEPENDENT_AMBULATORY_CARE_PROVIDER_SITE_OTHER): Payer: Self-pay

## 2018-01-30 ENCOUNTER — Ambulatory Visit (INDEPENDENT_AMBULATORY_CARE_PROVIDER_SITE_OTHER): Payer: Medicare Other

## 2018-01-30 ENCOUNTER — Encounter (INDEPENDENT_AMBULATORY_CARE_PROVIDER_SITE_OTHER): Payer: Self-pay | Admitting: Orthopaedic Surgery

## 2018-01-30 DIAGNOSIS — M5432 Sciatica, left side: Secondary | ICD-10-CM | POA: Diagnosis not present

## 2018-01-30 DIAGNOSIS — M4807 Spinal stenosis, lumbosacral region: Secondary | ICD-10-CM

## 2018-01-30 DIAGNOSIS — Z96642 Presence of left artificial hip joint: Secondary | ICD-10-CM | POA: Diagnosis not present

## 2018-01-30 NOTE — Progress Notes (Signed)
Office Visit Note   Patient: Julia Blair           Date of Birth: 1945/12/27           MRN: [id number] Visit Date: 01/30/2018              Requested by: Caryl Bis, MD [place], [place] [address] PCP: Caryl Bis, MD   Assessment & Plan: Visit Diagnoses:  1. History of left hip hemiarthroplasty   2. Sciatica, left side     Plan: Currently I do not see an indication for any further surgery to her left hip based on her exam findings and plain film findings.  I am not sure that the hip is the source of her pain and weakness.  I would like to obtain an MRI of her lumbar spine to rule out any type of nerve compression and stenosis to the left side that may account for her symptoms.  All there is there is some slight sclerotic changes of the distal end of the prosthesis based on exam I do not feel that this is significant loosening to be causing the issues that she is having.  We will see her back after the MRI of her lumbar spine to see if this sheds light on her left-sided pain and weakness.  Follow-Up Instructions: Return in about 3 weeks (around 02/20/2018).   Orders:  Orders Placed This Encounter  Procedures  . XR HIP UNILAT W OR W/O PELVIS 1V LEFT   No orders of the defined types were placed in this encounter.     Procedures: No procedures performed   Clinical Data: No additional findings.   Subjective: Chief Complaint  Patient presents with  . Left Hip - Follow-up  The patient is a 72 year old female who 1 of my partners performed a left hip hemiarthroplasty on 2 years ago to treat a displaced left hip femoral neck fracture.  Since then she has had a lot of issues with just weakness in general.  She ablates with a walking stick in her opposite side.  She has been through physical therapy as well and reports thigh pain and weakness.  Time she has pain from her back to her knee as well.  She is tried and failed all forms conservative treatment.  She denies  any groin pain.  She has not been sick in any way.  She was very active prior to the her hip fracture 2 years ago.  HPI  Review of Systems She currently denies any headache, chest pain, shortness of breath, fever, chills, nausea, vomiting.  Objective: Vital Signs: There were no vitals taken for this visit.  Physical Exam She is alert and oriented x3 and in no acute distress Ortho Exam Examination with her sitting, I can put her left hip through full internal and external rotation with no pain in the groin at all.  There seems to be no thigh pain today as well.  She is got weakness in her thigh muscles but she can extend her leg.  Distally her motor and sensory exam seems to be normal.  She has good flexion-extension of the lumbar spine as well. Specialty Comments:  No specialty comments available.  Imaging: Xr Hip Unilat W Or W/o Pelvis 1v Left  Result Date: 01/30/2018 An AP pelvis and a lateral of the left hip shows a hemiarthroplasty with no complicating features.  There is a slight sclerotic line distally to suggest micromotion of the  femoral component.    PMFS History: Patient Active Problem List   Diagnosis Date Noted  . History of left hip hemiarthroplasty 08/01/2017  . Trochanteric bursitis, left hip 12/06/2016  . Pain in left hip 07/25/2016  . Unsteadiness on feet   . HLD (hyperlipidemia) 12/13/2015  . Fall 12/13/2015  . Leukocytopenia 12/13/2015  . Closed left hip fracture, initial encounter ([facility]) 12/13/2015   Past Medical History:  Diagnosis Date  . High cholesterol     Family History  Problem Relation Age of Onset  . Asthma Mother   . Throat cancer Father   . Lung cancer Brother     Past Surgical History:  Procedure Laterality Date  . ABDOMINAL HYSTERECTOMY    . HIP ARTHROPLASTY Left 12/14/2015   Procedure: ARTHROPLASTY BIPOLAR HIP (HEMIARTHROPLASTY);  Surgeon: Newt Minion, MD;  Location: [facility];  Service: Orthopedics;  Laterality: Left;   Social  History   Occupational History  . Not on file  Tobacco Use  . Smoking status: Never Smoker  . Smokeless tobacco: Never Used  Substance and Sexual Activity  . Alcohol use: Yes    Comment: Social drink  . Drug use: No  . Sexual activity: Not on file

## 2018-02-15 ENCOUNTER — Ambulatory Visit
Admission: RE | Admit: 2018-02-15 | Discharge: 2018-02-15 | Disposition: A | Discharge status: Home / Self Care | Payer: Medicare Other | Source: Ambulatory Visit | Attending: Orthopaedic Surgery | Admitting: Orthopaedic Surgery

## 2018-02-15 DIAGNOSIS — M4807 Spinal stenosis, lumbosacral region: Secondary | ICD-10-CM

## 2018-02-15 DIAGNOSIS — M48061 Spinal stenosis, lumbar region without neurogenic claudication: Secondary | ICD-10-CM | POA: Diagnosis not present

## 2018-02-20 ENCOUNTER — Ambulatory Visit (INDEPENDENT_AMBULATORY_CARE_PROVIDER_SITE_OTHER): Payer: Medicare Other | Admitting: Orthopaedic Surgery

## 2018-02-20 ENCOUNTER — Encounter (INDEPENDENT_AMBULATORY_CARE_PROVIDER_SITE_OTHER): Payer: Self-pay | Admitting: Orthopaedic Surgery

## 2018-02-20 DIAGNOSIS — M4807 Spinal stenosis, lumbosacral region: Secondary | ICD-10-CM

## 2018-02-20 DIAGNOSIS — M25552 Pain in left hip: Secondary | ICD-10-CM

## 2018-02-20 DIAGNOSIS — Z96642 Presence of left artificial hip joint: Secondary | ICD-10-CM

## 2018-02-20 NOTE — Progress Notes (Signed)
The patient is continue to follow-up with left-sided hip and thigh pain and weakness in general on that side.  Again she is a patient that had a hip hemiarthroplasty done in 2017 by another physician.  She still denies any significant groin pain until recently.  She is very active 72 years old.  This is causing frustration for her.  I did obtain an MRI of her lumbar spine to rule out any type of pathology in the spine that is causing radicular symptoms going on the left side.  She is here for review the MRI today.  Unfortunately MRI did not show any significant nerve compression that would be accounting for her symptoms.  On range of motion of her left hip it does move fully and smoothly and there is just a slight amount of pain in the groin but definitely pain around her thigh.  Distally motor and sensory exam seems to be normal at left lower extremity.  At this point the other option from my standpoint would be to consider converting her left hip hemiarthroplasty into a total hip.  There is a high likelihood that the femoral component would need to be revised given the thigh pain that she is having and that this was a low demand stem/femoral component in someone who is adamant.  I am actually going to have my partner Dr. Erlinda Hong see her as a second opinion and to help in determining whether or not a revision of this left hip is warranted.

## 2018-03-12 DIAGNOSIS — E7800 Pure hypercholesterolemia, unspecified: Secondary | ICD-10-CM | POA: Diagnosis not present

## 2018-03-12 DIAGNOSIS — E785 Hyperlipidemia, unspecified: Secondary | ICD-10-CM | POA: Diagnosis not present

## 2018-03-12 DIAGNOSIS — I10 Essential (primary) hypertension: Secondary | ICD-10-CM | POA: Diagnosis not present

## 2018-03-15 DIAGNOSIS — M545 Low back pain: Secondary | ICD-10-CM | POA: Diagnosis not present

## 2018-03-15 DIAGNOSIS — K5900 Constipation, unspecified: Secondary | ICD-10-CM | POA: Diagnosis not present

## 2018-03-15 DIAGNOSIS — E785 Hyperlipidemia, unspecified: Secondary | ICD-10-CM | POA: Diagnosis not present

## 2018-03-27 ENCOUNTER — Ambulatory Visit (INDEPENDENT_AMBULATORY_CARE_PROVIDER_SITE_OTHER): Payer: Medicare Other | Admitting: Orthopaedic Surgery

## 2018-03-27 DIAGNOSIS — Z1231 Encounter for screening mammogram for malignant neoplasm of breast: Secondary | ICD-10-CM | POA: Diagnosis not present

## 2018-04-03 ENCOUNTER — Encounter (INDEPENDENT_AMBULATORY_CARE_PROVIDER_SITE_OTHER): Payer: Self-pay | Admitting: Orthopaedic Surgery

## 2018-04-03 ENCOUNTER — Ambulatory Visit (INDEPENDENT_AMBULATORY_CARE_PROVIDER_SITE_OTHER): Payer: Medicare Other | Admitting: Orthopaedic Surgery

## 2018-04-03 VITALS — Ht 63.0 in | Wt 150.0 lb

## 2018-04-03 DIAGNOSIS — Z96642 Presence of left artificial hip joint: Secondary | ICD-10-CM

## 2018-04-03 DIAGNOSIS — M25552 Pain in left hip: Secondary | ICD-10-CM | POA: Diagnosis not present

## 2018-04-03 NOTE — Progress Notes (Signed)
Office Visit Note   Patient: Julia Blair           Date of Birth: 11/11/1945           MRN: [id number] Visit Date: 04/03/2018              Requested by: Caryl Bis, MD [address] [place], [place] [address] PCP: Caryl Bis, MD   Assessment & Plan: Visit Diagnoses:  1. History of left hip hemiarthroplasty     Plan: Impression is chronic left hip and thigh pain.  She is very active normally and think that she may be causing her stem to undergo micromotion or even loosening.  I would like to obtain lab work to rule out infection.  We will also obtain a bone scan to evaluate for loosening.  Overall it sounds like her groin pain is not significant or a constant problem.  Sounds like the femoral stem may be the culprit.  We will see her back after the bone scan.  Questions encouraged and answered.  Follow-Up Instructions: Return in about 2 weeks (around 04/17/2018).   Orders:  No orders of the defined types were placed in this encounter.  No orders of the defined types were placed in this encounter.     Procedures: No procedures performed   Clinical Data: No additional findings.   Subjective: Chief Complaint  Patient presents with  . Left Hip - Pain    Julia Blair is a 73 year old female comes in as a second opinion for chronic left hip and thigh pain.  She underwent a left hemiarthroplasty in 2017 by Dr. Sharol Given.  Since then she has had chronic left hip and thigh pain.  She has had 2 cortisone injections in her trochanteric bursa which gave temporary relief.  She endorses occasional groin pain but more thigh pain.  She denies any significant back pain.  She had an MRI recently which could not explain her symptoms.  She has had x-rays which showed some lucency around the distal portion of the stem which may indicate micromotion versus loosening.  She denies any signs or symptoms of infection.   Review of Systems  Constitutional: Negative.   HENT: Negative.   Eyes: Negative.    Respiratory: Negative.   Cardiovascular: Negative.   Endocrine: Negative.   Musculoskeletal: Negative.   Neurological: Negative.   Hematological: Negative.   Psychiatric/Behavioral: Negative.   All other systems reviewed and are negative.    Objective: Vital Signs: Ht 5\' 3"  (1.6 m)   Wt 150 lb (68 kg)   BMI 26.57 kg/m   Physical Exam Vitals signs and nursing note reviewed.  Constitutional:      Appearance: She is well-developed.  HENT:     Head: Normocephalic and atraumatic.  Neck:     Musculoskeletal: Neck supple.  Pulmonary:     Effort: Pulmonary effort is normal.  Abdominal:     Palpations: Abdomen is soft.  Skin:    General: Skin is warm.     Capillary Refill: Capillary refill takes less than 2 seconds.  Neurological:     Mental Status: She is alert and oriented to person, place, and time.  Psychiatric:        Behavior: Behavior normal.        Thought Content: Thought content normal.        Judgment: Judgment normal.     Ortho Exam Left hip exam shows a fully healed surgical scar.  She has good range  of motion of her hip with some minor pain and discomfort in the groin area.  I am not able to elicit the thigh pain.  Corky Sox test causes discomfort in the posterior lateral hip region.  Negative sciatic tension signs. Specialty Comments:  No specialty comments available.  Imaging: No results found.   PMFS History: Patient Active Problem List   Diagnosis Date Noted  . History of left hip hemiarthroplasty 08/01/2017  . Trochanteric bursitis, left hip 12/06/2016  . Pain in left hip 07/25/2016  . Unsteadiness on feet   . HLD (hyperlipidemia) 12/13/2015  . Fall 12/13/2015  . Leukocytopenia 12/13/2015  . Closed left hip fracture, initial encounter ([place]) 12/13/2015   Past Medical History:  Diagnosis Date  . High cholesterol     Family History  Problem Relation Age of Onset  . Asthma Mother   . Throat cancer Father   . Lung cancer Brother     Past  Surgical History:  Procedure Laterality Date  . ABDOMINAL HYSTERECTOMY    . HIP ARTHROPLASTY Left 12/14/2015   Procedure: ARTHROPLASTY BIPOLAR HIP (HEMIARTHROPLASTY);  Surgeon: Newt Minion, MD;  Location: [facility];  Service: Orthopedics;  Laterality: Left;   Social History   Occupational History  . Not on file  Tobacco Use  . Smoking status: Never Smoker  . Smokeless tobacco: Never Used  Substance and Sexual Activity  . Alcohol use: Yes    Comment: Social drink  . Drug use: No  . Sexual activity: Not on file

## 2018-04-03 NOTE — Addendum Note (Signed)
Addended by: Precious Bard on: 04/03/2018 01:17 PM   Modules accepted: Orders

## 2018-04-04 LAB — C-REACTIVE PROTEIN: CRP: 2.1 mg/L (ref <8.0)

## 2018-04-04 LAB — SEDIMENTATION RATE: Sed Rate: 11 mm/h (ref 0–30)

## 2018-04-05 LAB — CBC WITH DIFFERENTIAL
BASOS: 1 %
Basophils Absolute: 0.1 10*3/uL (ref 0.0–0.2)
EOS (ABSOLUTE): 0 10*3/uL (ref 0.0–0.4)
EOS: 1 %
HEMATOCRIT: 41.5 % (ref 34.0–46.6)
HEMOGLOBIN: 13.3 g/dL (ref 11.1–15.9)
IMMATURE GRANULOCYTES: 0 %
Immature Grans (Abs): 0 10*3/uL (ref 0.0–0.1)
Lymphocytes Absolute: 1.3 10*3/uL (ref 0.7–3.1)
Lymphs: 19 %
MCH: 28.8 pg (ref 26.6–33.0)
MCHC: 32 g/dL (ref 31.5–35.7)
MCV: 90 fL (ref 79–97)
MONOS ABS: 0.5 10*3/uL (ref 0.1–0.9)
Monocytes: 7 %
Neutrophils Absolute: 5.1 10*3/uL (ref 1.4–7.0)
Neutrophils: 72 %
RBC: 4.62 x10E6/uL (ref 3.77–5.28)
RDW: 13.7 % (ref 11.7–15.4)
WBC: 7 10*3/uL (ref 3.4–10.8)

## 2018-04-17 ENCOUNTER — Ambulatory Visit (INDEPENDENT_AMBULATORY_CARE_PROVIDER_SITE_OTHER): Payer: Medicare Other | Admitting: Orthopaedic Surgery

## 2018-04-18 ENCOUNTER — Ambulatory Visit (INDEPENDENT_AMBULATORY_CARE_PROVIDER_SITE_OTHER): Payer: Medicare Other | Admitting: Orthopaedic Surgery

## 2018-04-24 ENCOUNTER — Encounter (HOSPITAL_COMMUNITY)
Admission: RE | Admit: 2018-04-24 | Discharge: 2018-04-24 | Disposition: A | Discharge status: Home / Self Care | Payer: Medicare Other | Source: Ambulatory Visit | Attending: Orthopaedic Surgery | Admitting: Orthopaedic Surgery

## 2018-04-24 DIAGNOSIS — M25552 Pain in left hip: Secondary | ICD-10-CM | POA: Diagnosis not present

## 2018-04-24 DIAGNOSIS — Z96642 Presence of left artificial hip joint: Secondary | ICD-10-CM | POA: Insufficient documentation

## 2018-04-24 MED ORDER — TECHNETIUM TC 99M MEDRONATE IV KIT
20.0000 | PACK | Freq: Once | INTRAVENOUS | Status: AC | PRN
  Administered 2018-04-24: 20 via INTRAVENOUS

## 2018-04-26 ENCOUNTER — Encounter (INDEPENDENT_AMBULATORY_CARE_PROVIDER_SITE_OTHER): Payer: Self-pay | Admitting: Orthopaedic Surgery

## 2018-04-26 ENCOUNTER — Ambulatory Visit (INDEPENDENT_AMBULATORY_CARE_PROVIDER_SITE_OTHER): Payer: Medicare Other | Admitting: Orthopaedic Surgery

## 2018-04-26 DIAGNOSIS — Z96642 Presence of left artificial hip joint: Secondary | ICD-10-CM

## 2018-04-26 DIAGNOSIS — M25552 Pain in left hip: Secondary | ICD-10-CM | POA: Diagnosis not present

## 2018-04-26 NOTE — Progress Notes (Signed)
Office Visit Note   Patient: Julia Blair           Date of Birth: 06-Aug-1945           MRN: [id number] Visit Date: 04/26/2018              Requested by: Caryl Bis, MD [place], [place] [address] PCP: Caryl Bis, MD   Assessment & Plan: Visit Diagnoses:  1. History of left hip hemiarthroplasty   2. Pain in left hip     Plan: The bone scan shows mild uptake around the proximal portion of the stem and some in the distal end consistent with micromotion.  I don't believe that the stem is grossly loose but loose enough to cause her symptoms.  At this point, we had a long discussion about the option of living with this discomfort and modifying her activities versus conversion to a total hip replacement.  I did stress that this may not fully alleviate her pain and that the stem may in fact be relatively ingrown which in that case would either necessitate an ETO to remove or just leave the stem alone.  Overall it sounds like her pain is significant enough that she is willing to consider a conversion to a total hip but she will need some time to think about this.  I discussed in detail what I would do including the surgical approach if I were the treating surgeon but I did recommend that she see Dr. Ninfa Linden again to get his opinion so that she can make an informed decision.  We will see her as needed.  Total face to face encounter time was greater than 25 minutes and over half of this time was spent in counseling and/or coordination of care.  Follow-Up Instructions: Return for f/u with [facility] .   Orders:  No orders of the defined types were placed in this encounter.  No orders of the defined types were placed in this encounter.     Procedures: No procedures performed   Clinical Data: No additional findings.   Subjective: Chief Complaint  Patient presents with  . Left Hip - Pain    [name] returns today for review of her bone scan.  She denies any changes in  her symptoms.  She reports that the thigh pain gets worse as the day goes on but not necessarily with each time she gets up to do activity.     Review of Systems   Objective: Vital Signs: There were no vitals taken for this visit.  Physical Exam  Ortho Exam Left lower extremity exam remains stable and unchanged.  Fully healed surgical scar from a posterior approach. Specialty Comments:  No specialty comments available.  Imaging: No results found.   PMFS History: Patient Active Problem List   Diagnosis Date Noted  . History of left hip hemiarthroplasty 08/01/2017  . Trochanteric bursitis, left hip 12/06/2016  . Pain in left hip 07/25/2016  . Unsteadiness on feet   . HLD (hyperlipidemia) 12/13/2015  . Fall 12/13/2015  . Leukocytopenia 12/13/2015  . Closed left hip fracture, initial encounter ([place]) 12/13/2015   Past Medical History:  Diagnosis Date  . High cholesterol     Family History  Problem Relation Age of Onset  . Asthma Mother   . Throat cancer Father   . Lung cancer Brother     Past Surgical History:  Procedure Laterality Date  . ABDOMINAL HYSTERECTOMY    . HIP  ARTHROPLASTY Left 12/14/2015   Procedure: ARTHROPLASTY BIPOLAR HIP (HEMIARTHROPLASTY);  Surgeon: Newt Minion, MD;  Location: [facility];  Service: Orthopedics;  Laterality: Left;   Social History   Occupational History  . Not on file  Tobacco Use  . Smoking status: Never Smoker  . Smokeless tobacco: Never Used  Substance and Sexual Activity  . Alcohol use: Yes    Comment: Social drink  . Drug use: No  . Sexual activity: Not on file

## 2018-05-10 ENCOUNTER — Ambulatory Visit (INDEPENDENT_AMBULATORY_CARE_PROVIDER_SITE_OTHER): Payer: Medicare Other | Admitting: Orthopaedic Surgery

## 2018-05-14 ENCOUNTER — Encounter (INDEPENDENT_AMBULATORY_CARE_PROVIDER_SITE_OTHER): Payer: Self-pay | Admitting: Orthopaedic Surgery

## 2018-05-14 ENCOUNTER — Ambulatory Visit (INDEPENDENT_AMBULATORY_CARE_PROVIDER_SITE_OTHER): Payer: Medicare Other | Admitting: Orthopaedic Surgery

## 2018-05-14 DIAGNOSIS — Z96642 Presence of left artificial hip joint: Secondary | ICD-10-CM

## 2018-05-14 DIAGNOSIS — M25552 Pain in left hip: Secondary | ICD-10-CM | POA: Diagnosis not present

## 2018-05-14 DIAGNOSIS — Z96649 Presence of unspecified artificial hip joint: Secondary | ICD-10-CM

## 2018-05-14 DIAGNOSIS — T8484XD Pain due to internal orthopedic prosthetic devices, implants and grafts, subsequent encounter: Secondary | ICD-10-CM | POA: Diagnosis not present

## 2018-05-14 NOTE — Progress Notes (Signed)
The patient is now over 2 years out from a left hip hemiarthroplasty that was done by 1 of my partners.  This was secondary to a femoral neck fracture.  She continues to complain of pain around the groin and the hip in general and sciatic symptoms.  We worked all this up extensively.  I even sent her for second opinion as it relates to potentially converting her left hip hemi-to a total hip arthroplasty.  There is question of whether or not the femoral component is loose.  At this point she is tried failed all forms of conservative treatment including injections.  She feels at this point that her life is adversely affected due to this hip pain.  We had a long thorough discussion today about converting this to a total hip arthroplasty.  This could be just converting the acetabular side and turning this over total hip with a new hip ball as well versus worse case scenario being a femoral revision if we find loosening and true loosening the femoral component.  Certainly that would even make a more extensive surgery which could cause fracture of the bone and need for an osteotomy which would be a more extensive surgery and a much longer recovery.  I showed her hip model and explained in long length in detail what this involves.  We had a long and thorough discussion about surgery including her operative course and postoperative course.  All question concerns were answered and addressed.  At this point she is failed conservative treatment for well over a year now and is very frustrated at her decreased mobility and continued pain.  I do feel at this point surgery is warranted and again stressed the complex nature of what this involves.  We will work on getting this scheduled.

## 2018-07-12 DIAGNOSIS — Z8781 Personal history of (healed) traumatic fracture: Secondary | ICD-10-CM | POA: Diagnosis not present

## 2018-07-12 DIAGNOSIS — E785 Hyperlipidemia, unspecified: Secondary | ICD-10-CM | POA: Diagnosis not present

## 2018-07-12 DIAGNOSIS — Z6824 Body mass index (BMI) 24.0-24.9, adult: Secondary | ICD-10-CM | POA: Diagnosis not present

## 2018-07-12 DIAGNOSIS — K5900 Constipation, unspecified: Secondary | ICD-10-CM | POA: Diagnosis not present

## 2018-07-12 DIAGNOSIS — M545 Low back pain: Secondary | ICD-10-CM | POA: Diagnosis not present

## 2018-07-17 ENCOUNTER — Ambulatory Visit (INDEPENDENT_AMBULATORY_CARE_PROVIDER_SITE_OTHER): Payer: Medicare Other | Admitting: Orthopaedic Surgery

## 2018-09-04 ENCOUNTER — Other Ambulatory Visit: Payer: Self-pay

## 2018-09-11 ENCOUNTER — Other Ambulatory Visit: Payer: Self-pay | Admitting: Physician Assistant

## 2018-09-13 NOTE — Pre-Procedure Instructions (Signed)
Julia Blair  09/13/2018      [facility] - [place], [place] [address] [place] [address] Phone: [phone] Fax: [phone]    Your procedure is scheduled on September 18, 2018.  Report to [facility] Entrance "A" at 1000 AM.  Call this number if you have problems the morning of surgery:  [phone]  Call [phone] if you have any questions prior to your surgery date Monday-Friday 8am-4pm    Remember:  Do not eat after midnight.  You may drink clear liquids until 900 AM.  Clear liquids allowed are: Ensure Pre Surgery drink,  Water, Juice (non-citric and without pulp), Clear Tea, Black Coffee only and Gatorade   Please complete your PRE-SURGERY ENSURE that was provided to you by 900 AM the morning of surgery.  Please, if able, drink it in one setting. DO NOT SIP.    Take these medicines the morning of surgery with A SIP OF WATER -none  Beginning now, STOP taking any Nabumetone (Relafen), diclofenac (voltaren) gel, Aspirin (unless otherwise instructed by your surgeon), Excedrin Migraine, Aleve, Naproxen, Ibuprofen, Motrin, Advil, Goody's, BC's, all herbal medications, fish oil, and all vitamins    Do not wear jewelry, make-up or nail polish.  Do not wear lotions, powders, or perfumes, or deodorant.  Do not shave 48 hours prior to surgery.  Do not bring valuables to the hospital.  [facility] is not responsible for any belongings or valuables.  Contacts, dentures or bridgework may not be worn into surgery.  Leave your suitcase in the car.  After surgery it may be brought to your room.  For patients admitted to the hospital, discharge time will be determined by your treatment team.  Patients discharged the day of surgery will not be allowed to drive home.    - Preparing For Surgery  Before surgery, you can play an important role. Because skin is not sterile, your skin needs to be as free of germs as possible. You can reduce the  number of germs on your skin by washing with CHG (chlorahexidine gluconate) Soap before surgery.  CHG is an antiseptic cleaner which kills germs and bonds with the skin to continue killing germs even after washing.    Oral Hygiene is also important to reduce your risk of infection.  Remember - BRUSH YOUR TEETH THE MORNING OF SURGERY WITH YOUR REGULAR TOOTHPASTE  Please do not use if you have an allergy to CHG or antibacterial soaps. If your skin becomes reddened/irritated stop using the CHG.  Do not shave (including legs and underarms) for at least 48 hours prior to first CHG shower. It is OK to shave your face.  Please follow these instructions carefully.   1. Shower the NIGHT BEFORE SURGERY and the MORNING OF SURGERY with CHG.   2. If you chose to wash your hair, wash your hair first as usual with your normal shampoo.  3. After you shampoo, rinse your hair and body thoroughly to remove the shampoo.  4. Use CHG as you would any other liquid soap. You can apply CHG directly to the skin and wash gently with a scrungie or a clean washcloth.   5. Apply the CHG Soap to your body ONLY FROM THE NECK DOWN.  Do not use on open wounds or open sores. Avoid contact with your eyes, ears, mouth and genitals (private parts). Wash Face and genitals (private parts)  with your normal soap.  6.  Wash thoroughly, paying special attention to the area where your surgery will be performed.  7. Thoroughly rinse your body with warm water from the neck down.  8. DO NOT shower/wash with your normal soap after using and rinsing off the CHG Soap.  9. Pat yourself dry with a CLEAN TOWEL.  10. Wear CLEAN PAJAMAS to bed the night before surgery, wear comfortable clothes the morning of surgery  11. Place CLEAN SHEETS on your bed the night of your first shower and DO NOT SLEEP WITH PETS.  Day of Surgery:  Do not apply any deodorants/lotions.  Please wear clean clothes to the hospital/surgery center.   Remember to  brush your teeth WITH YOUR REGULAR TOOTHPASTE.  Please read over the following fact sheets that you were given.

## 2018-09-13 NOTE — Progress Notes (Addendum)
PCP - Gar Ponto, MD Cardiologist - pt denies  Chest x-ray - denies past year- no respiratory complications EKG - denies past year  Stress Test - pt denies ECHO - pt denies  Cardiac Cath - pt denies  Sleep Study - pt denies CPAP - n/a  Fasting Blood Sugar - n/a Checks Blood Sugar _____ times a day-n/a  Blood Thinner Instructions: n/a Aspirin Instructions: n/a  Anesthesia review:no  Patient denies shortness of breath, fever, cough and chest pain at PAT appointment  Patient verbalized understanding of instructions that were given to them at the PAT appointment. Patient was also instructed that they will need to review over the PAT instructions again at home before surgery.

## 2018-09-14 ENCOUNTER — Other Ambulatory Visit: Payer: Self-pay

## 2018-09-14 ENCOUNTER — Encounter (HOSPITAL_COMMUNITY): Payer: Self-pay

## 2018-09-14 ENCOUNTER — Encounter (HOSPITAL_COMMUNITY)
Admission: RE | Admit: 2018-09-14 | Discharge: 2018-09-14 | Disposition: A | Discharge status: Home / Self Care | Payer: Medicare Other | Source: Ambulatory Visit | Attending: Orthopaedic Surgery | Admitting: Orthopaedic Surgery

## 2018-09-14 ENCOUNTER — Other Ambulatory Visit (HOSPITAL_COMMUNITY)
Admission: RE | Admit: 2018-09-14 | Discharge: 2018-09-14 | Disposition: A | Discharge status: Home / Self Care | Payer: Medicare Other | Source: Ambulatory Visit | Attending: Orthopaedic Surgery | Admitting: Orthopaedic Surgery

## 2018-09-14 DIAGNOSIS — Z1159 Encounter for screening for other viral diseases: Secondary | ICD-10-CM | POA: Diagnosis not present

## 2018-09-14 DIAGNOSIS — Z01812 Encounter for preprocedural laboratory examination: Secondary | ICD-10-CM | POA: Diagnosis not present

## 2018-09-14 HISTORY — DX: Unspecified urinary incontinence: R32

## 2018-09-14 HISTORY — DX: Unspecified osteoarthritis, unspecified site: M19.90

## 2018-09-14 LAB — CBC
HCT: 42.2 % (ref 36.0–46.0)
Hemoglobin: 13.5 g/dL (ref 12.0–15.0)
MCH: 29.8 pg (ref 26.0–34.0)
MCHC: 32 g/dL (ref 30.0–36.0)
MCV: 93.2 fL (ref 80.0–100.0)
Platelets: 278 10*3/uL (ref 150–400)
RBC: 4.53 MIL/uL (ref 3.87–5.11)
RDW: 12.9 % (ref 11.5–15.5)
WBC: 7.9 10*3/uL (ref 4.0–10.5)
nRBC: 0 % (ref 0.0–0.2)

## 2018-09-14 LAB — SARS CORONAVIRUS 2 (TAT 6-24 HRS): SARS Coronavirus 2: NEGATIVE

## 2018-09-14 LAB — BASIC METABOLIC PANEL
Anion gap: 9 (ref 5–15)
BUN: 12 mg/dL (ref 8–23)
CO2: 25 mmol/L (ref 22–32)
Calcium: 9.5 mg/dL (ref 8.9–10.3)
Chloride: 105 mmol/L (ref 98–111)
Creatinine, Ser: 0.68 mg/dL (ref 0.44–1.00)
GFR calc Af Amer: >60 mL/min (ref >60)
GFR calc non Af Amer: >60 mL/min (ref >60)
Glucose, Bld: 94 mg/dL (ref 70–99)
Potassium: 4 mmol/L (ref 3.5–5.1)
Sodium: 139 mmol/L (ref 135–145)

## 2018-09-14 LAB — TYPE AND SCREEN
ABO/RH(D): B POS
Antibody Screen: NEGATIVE

## 2018-09-14 LAB — SURGICAL PCR SCREEN
MRSA, PCR: NEGATIVE
Staphylococcus aureus: POSITIVE — AB

## 2018-09-18 ENCOUNTER — Inpatient Hospital Stay (HOSPITAL_COMMUNITY): Payer: Medicare Other

## 2018-09-18 ENCOUNTER — Inpatient Hospital Stay (HOSPITAL_COMMUNITY): Payer: Medicare Other | Admitting: Anesthesiology

## 2018-09-18 ENCOUNTER — Inpatient Hospital Stay (HOSPITAL_COMMUNITY)
Admission: RE | Admit: 2018-09-18 | Discharge: 2018-09-20 | DRG: 467 | Disposition: A | Discharge status: Home Health | Payer: Medicare Other | Attending: Orthopaedic Surgery | Admitting: Orthopaedic Surgery

## 2018-09-18 ENCOUNTER — Encounter (HOSPITAL_COMMUNITY)
Admission: RE | Disposition: A | Discharge status: Home / Self Care | Payer: Self-pay | Source: Home / Self Care | Attending: Orthopaedic Surgery

## 2018-09-18 ENCOUNTER — Other Ambulatory Visit: Payer: Self-pay

## 2018-09-18 ENCOUNTER — Encounter (HOSPITAL_COMMUNITY): Payer: Self-pay

## 2018-09-18 DIAGNOSIS — T84039A Mechanical loosening of unspecified internal prosthetic joint, initial encounter: Secondary | ICD-10-CM | POA: Diagnosis not present

## 2018-09-18 DIAGNOSIS — T84030A Mechanical loosening of internal right hip prosthetic joint, initial encounter: Secondary | ICD-10-CM

## 2018-09-18 DIAGNOSIS — T8484XA Pain due to internal orthopedic prosthetic devices, implants and grafts, initial encounter: Secondary | ICD-10-CM | POA: Diagnosis not present

## 2018-09-18 DIAGNOSIS — Z825 Family history of asthma and other chronic lower respiratory diseases: Secondary | ICD-10-CM | POA: Diagnosis not present

## 2018-09-18 DIAGNOSIS — Z96642 Presence of left artificial hip joint: Secondary | ICD-10-CM | POA: Diagnosis not present

## 2018-09-18 DIAGNOSIS — Z471 Aftercare following joint replacement surgery: Secondary | ICD-10-CM | POA: Diagnosis not present

## 2018-09-18 DIAGNOSIS — Z886 Allergy status to analgesic agent status: Secondary | ICD-10-CM | POA: Diagnosis not present

## 2018-09-18 DIAGNOSIS — Z801 Family history of malignant neoplasm of trachea, bronchus and lung: Secondary | ICD-10-CM | POA: Diagnosis not present

## 2018-09-18 DIAGNOSIS — Z96641 Presence of right artificial hip joint: Secondary | ICD-10-CM

## 2018-09-18 DIAGNOSIS — Z808 Family history of malignant neoplasm of other organs or systems: Secondary | ICD-10-CM | POA: Diagnosis not present

## 2018-09-18 DIAGNOSIS — M1612 Unilateral primary osteoarthritis, left hip: Secondary | ICD-10-CM | POA: Diagnosis not present

## 2018-09-18 DIAGNOSIS — T84038S Mechanical loosening of other internal prosthetic joint, sequela: Secondary | ICD-10-CM

## 2018-09-18 DIAGNOSIS — Z96649 Presence of unspecified artificial hip joint: Secondary | ICD-10-CM

## 2018-09-18 DIAGNOSIS — Z419 Encounter for procedure for purposes other than remedying health state, unspecified: Secondary | ICD-10-CM

## 2018-09-18 DIAGNOSIS — T84091A Other mechanical complication of internal left hip prosthesis, initial encounter: Secondary | ICD-10-CM | POA: Diagnosis not present

## 2018-09-18 DIAGNOSIS — Y792 Prosthetic and other implants, materials and accessory orthopedic devices associated with adverse incidents: Secondary | ICD-10-CM | POA: Diagnosis present

## 2018-09-18 DIAGNOSIS — E7800 Pure hypercholesterolemia, unspecified: Secondary | ICD-10-CM | POA: Diagnosis present

## 2018-09-18 DIAGNOSIS — D62 Acute posthemorrhagic anemia: Secondary | ICD-10-CM | POA: Diagnosis not present

## 2018-09-18 DIAGNOSIS — M25552 Pain in left hip: Secondary | ICD-10-CM | POA: Diagnosis present

## 2018-09-18 DIAGNOSIS — E785 Hyperlipidemia, unspecified: Secondary | ICD-10-CM | POA: Diagnosis not present

## 2018-09-18 HISTORY — PX: ANTERIOR HIP REVISION: SHX6527

## 2018-09-18 SURGERY — REVISION, TOTAL ARTHROPLASTY, HIP, ANTERIOR APPROACH
Anesthesia: Spinal | Site: Hip | Laterality: Left

## 2018-09-18 MED ORDER — ACETAMINOPHEN 325 MG PO TABS
325.0000 mg | ORAL_TABLET | Freq: Four times a day (QID) | ORAL | Status: DC | PRN
  Administered 2018-09-19: 650 mg via ORAL
  Filled 2018-09-18: qty 1
  Filled 2018-09-18: qty 2

## 2018-09-18 MED ORDER — ONDANSETRON HCL 4 MG PO TABS: 4.0000 mg | ORAL_TABLET | Freq: Four times a day (QID) | ORAL | Status: DC | PRN

## 2018-09-18 MED ORDER — MIDAZOLAM HCL 2 MG/2ML IJ SOLN
INTRAMUSCULAR | Status: AC
  Filled 2018-09-18: qty 2

## 2018-09-18 MED ORDER — TRANEXAMIC ACID-NACL 1000-0.7 MG/100ML-% IV SOLN
INTRAVENOUS | Status: AC
  Filled 2018-09-18: qty 100

## 2018-09-18 MED ORDER — CEFAZOLIN SODIUM-DEXTROSE 1-4 GM/50ML-% IV SOLN
1.0000 g | Freq: Four times a day (QID) | INTRAVENOUS | Status: AC
  Administered 2018-09-18 (×2): 1 g via INTRAVENOUS
  Filled 2018-09-18 (×2): qty 50

## 2018-09-18 MED ORDER — FENTANYL CITRATE (PF) 100 MCG/2ML IJ SOLN
INTRAMUSCULAR | Status: AC
  Filled 2018-09-18: qty 2

## 2018-09-18 MED ORDER — METHOCARBAMOL 1000 MG/10ML IJ SOLN
500.0000 mg | Freq: Four times a day (QID) | INTRAVENOUS | Status: DC | PRN
  Filled 2018-09-18 (×2): qty 5

## 2018-09-18 MED ORDER — ALUM & MAG HYDROXIDE-SIMETH 200-200-20 MG/5ML PO SUSP: 30.0000 mL | ORAL | Status: DC | PRN

## 2018-09-18 MED ORDER — CEFAZOLIN SODIUM-DEXTROSE 2-4 GM/100ML-% IV SOLN
2.0000 g | INTRAVENOUS | Status: AC
  Administered 2018-09-18: 2 g via INTRAVENOUS

## 2018-09-18 MED ORDER — METOCLOPRAMIDE HCL 5 MG/ML IJ SOLN: 5.0000 mg | Freq: Three times a day (TID) | INTRAMUSCULAR | Status: DC | PRN

## 2018-09-18 MED ORDER — ONDANSETRON HCL 4 MG/2ML IJ SOLN: 4.0000 mg | Freq: Four times a day (QID) | INTRAMUSCULAR | Status: DC | PRN

## 2018-09-18 MED ORDER — ASPIRIN 81 MG PO CHEW
81.0000 mg | CHEWABLE_TABLET | Freq: Two times a day (BID) | ORAL | Status: DC
  Administered 2018-09-18 – 2018-09-20 (×4): 81 mg via ORAL
  Filled 2018-09-18 (×4): qty 1

## 2018-09-18 MED ORDER — MELATONIN 3 MG PO TABS
9.0000 mg | ORAL_TABLET | Freq: Every evening | ORAL | Status: DC | PRN
  Filled 2018-09-18: qty 3

## 2018-09-18 MED ORDER — DEXAMETHASONE SODIUM PHOSPHATE 10 MG/ML IJ SOLN
INTRAMUSCULAR | Status: DC | PRN
  Administered 2018-09-18: 4 mg via INTRAVENOUS

## 2018-09-18 MED ORDER — SODIUM CHLORIDE 0.9 % IR SOLN
Status: DC | PRN
  Administered 2018-09-18: 3000 mL

## 2018-09-18 MED ORDER — ACETAMINOPHEN 500 MG PO TABS: 1000.0000 mg | ORAL_TABLET | Freq: Once | ORAL | Status: DC

## 2018-09-18 MED ORDER — HYDROCODONE-ACETAMINOPHEN 5-325 MG PO TABS
1.0000 | ORAL_TABLET | ORAL | Status: DC | PRN
  Administered 2018-09-18: 1 via ORAL
  Administered 2018-09-19 – 2018-09-20 (×5): 2 via ORAL
  Filled 2018-09-18: qty 1
  Filled 2018-09-18 (×5): qty 2

## 2018-09-18 MED ORDER — CO Q 10 100 MG PO CAPS: 100.0000 mg | ORAL_CAPSULE | Freq: Every day | ORAL | Status: DC

## 2018-09-18 MED ORDER — POVIDONE-IODINE 10 % EX SWAB: 2.0000 "application " | Freq: Once | CUTANEOUS | Status: DC

## 2018-09-18 MED ORDER — DIPHENHYDRAMINE HCL 12.5 MG/5ML PO ELIX: 12.5000 mg | ORAL_SOLUTION | ORAL | Status: DC | PRN

## 2018-09-18 MED ORDER — CHLORHEXIDINE GLUCONATE 4 % EX LIQD: 60.0000 mL | Freq: Once | CUTANEOUS | Status: DC

## 2018-09-18 MED ORDER — MIDAZOLAM HCL 5 MG/5ML IJ SOLN
INTRAMUSCULAR | Status: DC | PRN
  Administered 2018-09-18: 2 mg via INTRAVENOUS

## 2018-09-18 MED ORDER — FENTANYL CITRATE (PF) 250 MCG/5ML IJ SOLN
INTRAMUSCULAR | Status: AC
  Filled 2018-09-18: qty 5

## 2018-09-18 MED ORDER — LACTATED RINGERS IV SOLN
INTRAVENOUS | Status: DC | PRN
  Administered 2018-09-18 (×2): via INTRAVENOUS

## 2018-09-18 MED ORDER — LIDOCAINE 2% (20 MG/ML) 5 ML SYRINGE
INTRAMUSCULAR | Status: AC
  Filled 2018-09-18: qty 5

## 2018-09-18 MED ORDER — MORPHINE SULFATE (PF) 2 MG/ML IV SOLN: 0.5000 mg | INTRAVENOUS | Status: DC | PRN

## 2018-09-18 MED ORDER — 0.9 % SODIUM CHLORIDE (POUR BTL) OPTIME
TOPICAL | Status: DC | PRN
  Administered 2018-09-18: 1000 mL

## 2018-09-18 MED ORDER — ADULT MULTIVITAMIN W/MINERALS CH
1.0000 | ORAL_TABLET | Freq: Every day | ORAL | Status: DC
  Administered 2018-09-18 – 2018-09-20 (×3): 1 via ORAL
  Filled 2018-09-18 (×3): qty 1

## 2018-09-18 MED ORDER — CEFAZOLIN SODIUM-DEXTROSE 2-4 GM/100ML-% IV SOLN
INTRAVENOUS | Status: AC
  Filled 2018-09-18: qty 100

## 2018-09-18 MED ORDER — OXYCODONE HCL 5 MG PO TABS
ORAL_TABLET | ORAL | Status: AC
  Filled 2018-09-18: qty 1

## 2018-09-18 MED ORDER — LIDOCAINE 2% (20 MG/ML) 5 ML SYRINGE
INTRAMUSCULAR | Status: DC | PRN
  Administered 2018-09-18: 40 mg via INTRAVENOUS

## 2018-09-18 MED ORDER — DEXAMETHASONE SODIUM PHOSPHATE 10 MG/ML IJ SOLN
INTRAMUSCULAR | Status: AC
  Filled 2018-09-18: qty 1

## 2018-09-18 MED ORDER — MENTHOL 3 MG MT LOZG: 1.0000 | LOZENGE | OROMUCOSAL | Status: DC | PRN

## 2018-09-18 MED ORDER — OXYCODONE HCL 5 MG PO TABS
5.0000 mg | ORAL_TABLET | Freq: Once | ORAL | Status: AC
  Administered 2018-09-18: 5 mg via ORAL

## 2018-09-18 MED ORDER — TRANEXAMIC ACID-NACL 1000-0.7 MG/100ML-% IV SOLN
1000.0000 mg | INTRAVENOUS | Status: AC
  Administered 2018-09-18: 1000 mg via INTRAVENOUS
  Filled 2018-09-18: qty 100

## 2018-09-18 MED ORDER — HYDROCODONE-ACETAMINOPHEN 7.5-325 MG PO TABS
1.0000 | ORAL_TABLET | ORAL | Status: DC | PRN
  Administered 2018-09-18: 1 via ORAL
  Administered 2018-09-19 – 2018-09-20 (×2): 2 via ORAL
  Filled 2018-09-18: qty 2
  Filled 2018-09-18: qty 1
  Filled 2018-09-18: qty 2

## 2018-09-18 MED ORDER — SODIUM CHLORIDE 0.9 % IV SOLN
INTRAVENOUS | Status: DC
  Administered 2018-09-18: 16:00:00 via INTRAVENOUS

## 2018-09-18 MED ORDER — POLYETHYLENE GLYCOL 3350 17 G PO PACK: 17.0000 g | PACK | Freq: Every day | ORAL | Status: DC | PRN

## 2018-09-18 MED ORDER — DOCUSATE SODIUM 100 MG PO CAPS
100.0000 mg | ORAL_CAPSULE | Freq: Two times a day (BID) | ORAL | Status: DC
  Administered 2018-09-18 – 2018-09-20 (×4): 100 mg via ORAL
  Filled 2018-09-18 (×5): qty 1

## 2018-09-18 MED ORDER — PHENOL 1.4 % MT LIQD: 1.0000 | OROMUCOSAL | Status: DC | PRN

## 2018-09-18 MED ORDER — ONDANSETRON HCL 4 MG/2ML IJ SOLN
INTRAMUSCULAR | Status: AC
  Filled 2018-09-18: qty 2

## 2018-09-18 MED ORDER — PROPOFOL 500 MG/50ML IV EMUL
INTRAVENOUS | Status: DC | PRN
  Administered 2018-09-18: 75 ug/kg/min via INTRAVENOUS

## 2018-09-18 MED ORDER — METHOCARBAMOL 500 MG PO TABS
500.0000 mg | ORAL_TABLET | Freq: Four times a day (QID) | ORAL | Status: DC | PRN
  Administered 2018-09-18 – 2018-09-19 (×5): 500 mg via ORAL
  Filled 2018-09-18 (×6): qty 1

## 2018-09-18 MED ORDER — PHENYLEPHRINE HCL-NACL 10-0.9 MG/250ML-% IV SOLN
INTRAVENOUS | Status: AC
  Filled 2018-09-18: qty 250

## 2018-09-18 MED ORDER — PANTOPRAZOLE SODIUM 40 MG PO TBEC
40.0000 mg | DELAYED_RELEASE_TABLET | Freq: Every day | ORAL | Status: DC
  Administered 2018-09-18 – 2018-09-20 (×3): 40 mg via ORAL
  Filled 2018-09-18 (×3): qty 1

## 2018-09-18 MED ORDER — ONDANSETRON HCL 4 MG/2ML IJ SOLN
INTRAMUSCULAR | Status: DC | PRN
  Administered 2018-09-18: 4 mg via INTRAVENOUS

## 2018-09-18 MED ORDER — METOCLOPRAMIDE HCL 5 MG PO TABS: 5.0000 mg | ORAL_TABLET | Freq: Three times a day (TID) | ORAL | Status: DC | PRN

## 2018-09-18 MED ORDER — PROPOFOL 10 MG/ML IV BOLUS
INTRAVENOUS | Status: DC | PRN
  Administered 2018-09-18: 10 mg via INTRAVENOUS
  Administered 2018-09-18 (×2): 20 mg via INTRAVENOUS

## 2018-09-18 MED ORDER — EPHEDRINE SULFATE-NACL 50-0.9 MG/10ML-% IV SOSY
PREFILLED_SYRINGE | INTRAVENOUS | Status: DC | PRN
  Administered 2018-09-18: 5 mg via INTRAVENOUS
  Administered 2018-09-18 (×2): 10 mg via INTRAVENOUS
  Administered 2018-09-18: 15 mg via INTRAVENOUS
  Administered 2018-09-18: 10 mg via INTRAVENOUS

## 2018-09-18 MED ORDER — FENTANYL CITRATE (PF) 100 MCG/2ML IJ SOLN
25.0000 ug | INTRAMUSCULAR | Status: DC | PRN
  Administered 2018-09-18 (×2): 25 ug via INTRAVENOUS
  Administered 2018-09-18: 50 ug via INTRAVENOUS

## 2018-09-18 SURGICAL SUPPLY — 56 items
APL SKNCLS STERI-STRIP NONHPOA (GAUZE/BANDAGES/DRESSINGS) ×1
BENZOIN TINCTURE PRP APPL 2/3 (GAUZE/BANDAGES/DRESSINGS) ×2 IMPLANT
BLADE CLIPPER SURG (BLADE) IMPLANT
BLADE SAW SGTL 18X1.27X75 (BLADE) ×2 IMPLANT
COVER SURGICAL LIGHT HANDLE (MISCELLANEOUS) ×2 IMPLANT
COVER WAND RF STERILE (DRAPES) ×2 IMPLANT
CUP SECTOR GRIPTON 50MM (Cup) ×1 IMPLANT
DRAPE C-ARM 42X72 X-RAY (DRAPES) ×2 IMPLANT
DRAPE STERI IOBAN 125X83 (DRAPES) ×2 IMPLANT
DRAPE U-SHAPE 47X51 STRL (DRAPES) ×6 IMPLANT
DRSG AQUACEL AG ADV 3.5X10 (GAUZE/BANDAGES/DRESSINGS) ×2 IMPLANT
DRSG XEROFORM 1X8 (GAUZE/BANDAGES/DRESSINGS) ×1 IMPLANT
DURAPREP 26ML APPLICATOR (WOUND CARE) ×2 IMPLANT
ELECT BLADE 4.0 EZ CLEAN MEGAD (MISCELLANEOUS) ×2
ELECT BLADE 6.5 EXT (BLADE) IMPLANT
ELECT REM PT RETURN 9FT ADLT (ELECTROSURGICAL) ×2
ELECTRODE BLDE 4.0 EZ CLN MEGD (MISCELLANEOUS) ×1 IMPLANT
ELECTRODE REM PT RTRN 9FT ADLT (ELECTROSURGICAL) ×1 IMPLANT
FACESHIELD WRAPAROUND (MASK) ×4 IMPLANT
FACESHIELD WRAPAROUND OR TEAM (MASK) ×2 IMPLANT
GLOVE BIOGEL PI IND STRL 8 (GLOVE) ×2 IMPLANT
GLOVE BIOGEL PI INDICATOR 8 (GLOVE) ×2
GLOVE ECLIPSE 8.0 STRL XLNG CF (GLOVE) ×2 IMPLANT
GLOVE ORTHO TXT STRL SZ7.5 (GLOVE) ×4 IMPLANT
GOWN STRL REUS W/ TWL LRG LVL3 (GOWN DISPOSABLE) ×2 IMPLANT
GOWN STRL REUS W/ TWL XL LVL3 (GOWN DISPOSABLE) ×2 IMPLANT
GOWN STRL REUS W/TWL LRG LVL3 (GOWN DISPOSABLE) ×4
GOWN STRL REUS W/TWL XL LVL3 (GOWN DISPOSABLE) ×4
HANDPIECE INTERPULSE COAX TIP (DISPOSABLE) ×2
HEAD FEM STD 32X+5 STRL (Hips) ×1 IMPLANT
KIT BASIN OR (CUSTOM PROCEDURE TRAY) ×2 IMPLANT
KIT TURNOVER KIT B (KITS) ×2 IMPLANT
LINER ACETABULAR 32X50 (Liner) ×1 IMPLANT
MANIFOLD NEPTUNE II (INSTRUMENTS) ×2 IMPLANT
NS IRRIG 1000ML POUR BTL (IV SOLUTION) ×2 IMPLANT
PACK TOTAL JOINT (CUSTOM PROCEDURE TRAY) ×2 IMPLANT
PAD ARMBOARD 7.5X6 YLW CONV (MISCELLANEOUS) ×2 IMPLANT
SCREW 6.5MMX30MM (Screw) ×1 IMPLANT
SET HNDPC FAN SPRY TIP SCT (DISPOSABLE) ×1 IMPLANT
STAPLER VISISTAT 35W (STAPLE) IMPLANT
STEM CORAIL KA12 (Stem) ×1 IMPLANT
STRIP CLOSURE SKIN 1/2X4 (GAUZE/BANDAGES/DRESSINGS) ×4 IMPLANT
SUT ETHIBOND NAB CT1 #1 30IN (SUTURE) ×2 IMPLANT
SUT MNCRL AB 4-0 PS2 18 (SUTURE) IMPLANT
SUT VIC AB 0 CT1 27 (SUTURE) ×2
SUT VIC AB 0 CT1 27XBRD ANBCTR (SUTURE) ×1 IMPLANT
SUT VIC AB 1 CT1 27 (SUTURE) ×2
SUT VIC AB 1 CT1 27XBRD ANBCTR (SUTURE) ×1 IMPLANT
SUT VIC AB 2-0 CT1 27 (SUTURE) ×2
SUT VIC AB 2-0 CT1 TAPERPNT 27 (SUTURE) ×1 IMPLANT
TOWEL GREEN STERILE (TOWEL DISPOSABLE) ×2 IMPLANT
TOWEL GREEN STERILE FF (TOWEL DISPOSABLE) ×2 IMPLANT
TRAY CATH 16FR W/PLASTIC CATH (SET/KITS/TRAYS/PACK) IMPLANT
TRAY FOLEY W/BAG SLVR 16FR (SET/KITS/TRAYS/PACK)
TRAY FOLEY W/BAG SLVR 16FR ST (SET/KITS/TRAYS/PACK) IMPLANT
WATER STERILE IRR 1000ML POUR (IV SOLUTION) ×4 IMPLANT

## 2018-09-18 NOTE — Transfer of Care (Addendum)
Immediate Anesthesia Transfer of Care Note  Patient: Julia Blair  Procedure(s) Performed: Conversion of Left Hip Hemiarthroplasty to Total Hip (Left Hip)  Patient Location: PACU  Anesthesia Type:MAC and Spinal  Level of Consciousness: awake and patient cooperative  Airway & Oxygen Therapy: Patient Spontanous Breathing  Post-op Assessment: Report given to RN and Post -op Vital signs reviewed and stable  Post vital signs: Reviewed and stable  Last Vitals:  Vitals Value Taken Time  BP 100/61 09/18/18 1419  Temp    Pulse 68 09/18/18 1420  Resp 14 09/18/18 1420  SpO2 95 % 09/18/18 1420  Vitals shown include unvalidated device data.  Last Pain:  Vitals:   09/18/18 0932  TempSrc: Oral  PainSc: 5       Patients Stated Pain Goal: 3 (69/62/95 2841)  Complications: No apparent anesthesia complications

## 2018-09-18 NOTE — Evaluation (Signed)
Physical Therapy Evaluation Patient Details Name: Julia Blair MRN: [id number] DOB: 1945/12/25 Today's Date: 09/18/2018   History of Present Illness  Pt is a 73 y/o female s/p L total hip revision secondary to painful L hemiarthroplasty components. PMH includes L hemiarthroplasty.   Clinical Impression  Pt s/p surgery above with deficits below. Pt limited this session secondary to pain. Required min to min guard A for mobility tasks using RW. Feel pt will progress well once pain controlled. Will continue to follow acutely to maximize functional mobility independence and safety.     Follow Up Recommendations Follow surgeon's recommendation for DC plan and follow-up therapies;Supervision for mobility/OOB    Equipment Recommendations  None recommended by PT    Recommendations for Other Services       Precautions / Restrictions Precautions Precautions: Fall Restrictions Weight Bearing Restrictions: Yes LLE Weight Bearing: Weight bearing as tolerated      Mobility  Bed Mobility Overal bed mobility: Needs Assistance Bed Mobility: Supine to Sit     Supine to sit: Min assist     General bed mobility comments: Min A for LLE assist. Increased time to come to sitting.   Transfers Overall transfer level: Needs assistance Equipment used: Rolling walker (2 wheeled) Transfers: Sit to/from Stand Sit to Stand: Min assist         General transfer comment: Min A for lift assist and steadying. Cues for safe hand placement.   Ambulation/Gait Ambulation/Gait assistance: Min guard Gait Distance (Feet): 5 Feet Assistive device: Rolling walker (2 wheeled) Gait Pattern/deviations: Step-to pattern;Decreased step length - right;Decreased step length - left;Decreased weight shift to left;Antalgic Gait velocity: Decreased    General Gait Details: Slow, antalgic gait. Cues for sequencing using RW. Pt with increased pain, so distance limited to chair.   Stairs             Wheelchair Mobility    Modified Rankin (Stroke Patients Only)       Balance Overall balance assessment: Needs assistance Sitting-balance support: No upper extremity supported;Feet supported Sitting balance-Leahy Scale: Good     Standing balance support: Bilateral upper extremity supported;During functional activity Standing balance-Leahy Scale: Poor Standing balance comment: Reliant on BUE support                              Pertinent Vitals/Pain Pain Assessment: Faces Faces Pain Scale: Hurts whole lot Pain Location: L hip Pain Descriptors / Indicators: Aching;Operative site guarding Pain Intervention(s): Limited activity within patient's tolerance;Monitored during session;Repositioned    Home Living Family/patient expects to be discharged to:: Private residence Living Arrangements: Spouse/significant other;Children Available Help at Discharge: Family;Available 24 hours/day Type of Home: Mobile home Home Access: Ramped entrance     Home Layout: Other (Comment)(1 step to bathroom ) Home Equipment: Walker - 2 wheels;Bedside commode;Cane - single point Additional Comments: Has been living in RV type home since [place] began.     Prior Function Level of Independence: Independent with assistive device(s)         Comments: Uses cane occasionally. Otherwise is independent.      Hand Dominance        Extremity/Trunk Assessment   Upper Extremity Assessment Upper Extremity Assessment: Overall WFL for tasks assessed    Lower Extremity Assessment Lower Extremity Assessment: LLE deficits/detail LLE Deficits / Details: Deficits consistent with post op pain and weakness. ROM limited secondary to pain.     Cervical / Trunk Assessment Cervical /  Trunk Assessment: Normal  Communication   Communication: No difficulties  Cognition Arousal/Alertness: Awake/alert Behavior During Therapy: WFL for tasks assessed/performed Overall Cognitive Status: Within  Functional Limits for tasks assessed                                        General Comments      Exercises Total Joint Exercises Ankle Circles/Pumps: AROM;Both;20 reps;Supine   Assessment/Plan    PT Assessment Patient needs continued PT services  PT Problem List Decreased strength;Decreased range of motion;Decreased activity tolerance;Decreased balance;Decreased mobility;Decreased knowledge of use of DME;Pain       PT Treatment Interventions DME instruction;Gait training;Functional mobility training;Therapeutic exercise;Therapeutic activities;Stair training;Balance training;Patient/family education    PT Goals (Current goals can be found in the Care Plan section)  Acute Rehab PT Goals Patient Stated Goal: for pain to improve PT Goal Formulation: With patient Time For Goal Achievement: 10/02/18 Potential to Achieve Goals: Good    Frequency 7X/week   Barriers to discharge        Co-evaluation               AM-PAC PT "6 Clicks" Mobility  Outcome Measure Help needed turning from your back to your side while in a flat bed without using bedrails?: A Little Help needed moving from lying on your back to sitting on the side of a flat bed without using bedrails?: A Little Help needed moving to and from a bed to a chair (including a wheelchair)?: A Little Help needed standing up from a chair using your arms (e.g., wheelchair or bedside chair)?: A Little Help needed to walk in hospital room?: A Little Help needed climbing 3-5 steps with a railing? : A Lot 6 Click Score: 17    End of Session Equipment Utilized During Treatment: Gait belt Activity Tolerance: Patient limited by pain Patient left: in chair;with call bell/phone within reach Nurse Communication: Mobility status PT Visit Diagnosis: Muscle weakness (generalized) (M62.81);Other abnormalities of gait and mobility (R26.89);Pain Pain - Right/Left: Left Pain - part of body: Hip    Time:  9450-3888 PT Time Calculation (min) (ACUTE ONLY): 23 min   Charges:   PT Evaluation $PT Eval Low Complexity: 1 Low PT Treatments $Therapeutic Activity: 8-22 mins        Leighton Ruff, PT, DPT  Acute Rehabilitation Services  Pager: [phone] Office: [phone]   Rudean Hitt 09/18/2018, 6:38 PM

## 2018-09-18 NOTE — Progress Notes (Signed)
Orthopedic Tech Progress Note Patient Details:  Julia Blair 10-24-1945 [id number] Applied over head frame with trapeze Patient ID: Astrid Drafts, female   DOB: 06-27-1945, 73 y.o.   MRN: [id number]   Janit Pagan 09/18/2018, 4:24 PM

## 2018-09-18 NOTE — Anesthesia Preprocedure Evaluation (Addendum)
Anesthesia Evaluation  Patient identified by MRN, date of birth, ID band Patient awake    Reviewed: Allergy & Precautions, H&P , NPO status , Patient's Chart, lab work & pertinent test results  Airway Mallampati: II  TM Distance: >3 FB Neck ROM: Full    Dental no notable dental hx. (+) Partial Upper, Dental Advisory Given   Pulmonary neg pulmonary ROS,    Pulmonary exam normal breath sounds clear to auscultation       Cardiovascular negative cardio ROS   Rhythm:Regular Rate:Normal     Neuro/Psych negative neurological ROS  negative psych ROS   GI/Hepatic negative GI ROS, Neg liver ROS,   Endo/Other  negative endocrine ROS  Renal/GU negative Renal ROS  negative genitourinary   Musculoskeletal  (+) Arthritis , Osteoarthritis,    Abdominal   Peds  Hematology negative hematology ROS (+)   Anesthesia Other Findings   Reproductive/Obstetrics negative OB ROS                            Anesthesia Physical Anesthesia Plan  ASA: II  Anesthesia Plan: Spinal   Post-op Pain Management:    Induction: Intravenous  PONV Risk Score and Plan: 3 and Ondansetron, Propofol infusion and Dexamethasone  Airway Management Planned: Simple Face Mask  Additional Equipment:   Intra-op Plan:   Post-operative Plan:   Informed Consent: I have reviewed the patients History and Physical, chart, labs and discussed the procedure including the risks, benefits and alternatives for the proposed anesthesia with the patient or authorized representative who has indicated his/her understanding and acceptance.     Dental advisory given  Plan Discussed with: CRNA  Anesthesia Plan Comments:         Anesthesia Quick Evaluation

## 2018-09-18 NOTE — Progress Notes (Signed)
PHARMACIST - PHYSICIAN ORDER COMMUNICATION  CONCERNING: P&T Medication Policy on Herbal Medications  DESCRIPTION:  This patient's order for:  Co Q 10  has been noted.  This product(s) is classified as an "herbal" or natural product. Due to a lack of definitive safety studies or FDA approval, nonstandard manufacturing practices, plus the potential risk of unknown drug-drug interactions while on inpatient medications, the Pharmacy and Therapeutics Committee does not permit the use of "herbal" or natural products of this type within [facility].   ACTION TAKEN: The pharmacy department is unable to verify this order at this time and your patient has been informed of this safety policy. Please reevaluate patient's clinical condition at discharge and address if the herbal or natural product(s) should be resumed at that time.   Dayn Barich A. Taiquan Campanaro, PharmD, BCPS Clinical Pharmacist  Please utilize Amion for appropriate phone number to reach the unit pharmacist ([facility])   

## 2018-09-18 NOTE — H&P (Signed)
TOTAL HIP REVISION ADMISSION H&P  Patient is admitted for left revision total hip arthroplasty.  Subjective:  Chief Complaint: left hip pain  HPI: Julia Blair, 73 y.o. female, has a history of pain and functional disability in the left hip due to failed previous left hip hemiarthroplasty and patient has failed non-surgical conservative treatments for greater than 12 weeks to include NSAID's and/or analgesics, flexibility and strengthening excercises, supervised PT with diminished ADL's post treatment, use of assistive devices and activity modification. The indications for the revision total hip arthroplasty are loosening of one or more components and bearing surface wear leading to  hip, groin, snd thigh pain.  Onset of symptoms was gradual starting 3 years ago with rapidlly worsening course since that time.  Prior procedures on the left hip include hemi-arthroplasty.  Patient currently rates pain in the left hip at 8 out of 10 with activity.  There is night pain, worsening of pain with activity and weight bearing, pain that interfers with activities of daily living and pain with passive range of motion. Patient has evidence of prosthetic loosening by imaging studies.  This condition presents safety issues increasing the risk of falls.  This patient has had failure of hemi-arthroplasty.  There is no current active infection.  Patient Active Problem List   Diagnosis Date Noted  . Aseptic loosening of prosthetic hip, sequela 09/18/2018  . History of left hip hemiarthroplasty 08/01/2017  . Trochanteric bursitis, left hip 12/06/2016  . Pain in left hip 07/25/2016  . Unsteadiness on feet   . HLD (hyperlipidemia) 12/13/2015  . Fall 12/13/2015  . Leukocytopenia 12/13/2015  . Closed left hip fracture, initial encounter ([place]) 12/13/2015   Past Medical History:  Diagnosis Date  . Arthritis   . Bladder incontinence   . High cholesterol     Past Surgical History:  Procedure Laterality Date  .  ABDOMINAL HYSTERECTOMY    . CYST EXCISION Right    right arm  . HIP ARTHROPLASTY Left 12/14/2015   Procedure: ARTHROPLASTY BIPOLAR HIP (HEMIARTHROPLASTY);  Surgeon: Newt Minion, MD;  Location: [facility];  Service: Orthopedics;  Laterality: Left;    Current Facility-Administered Medications  Medication Dose Route Frequency Provider Last Rate Last Dose  . acetaminophen (TYLENOL) tablet 1,000 mg  1,000 mg Oral Once Roderic Palau, MD      . ceFAZolin (ANCEF) 2-4 GM/100ML-% IVPB           . ceFAZolin (ANCEF) IVPB 2g/100 mL premix  2 g Intravenous On Call to OR Mcarthur Rossetti, MD      . chlorhexidine (HIBICLENS) 4 % liquid 4 application  60 mL Topical Once Erskine Emery W, PA-C      . povidone-iodine 10 % swab 2 application  2 application Topical Once Erskine Emery W, PA-C      . tranexamic acid (CYKLOKAPRON) 1000MG /115mL IVPB           . tranexamic acid (CYKLOKAPRON) IVPB 1,000 mg  1,000 mg Intravenous To OR Mcarthur Rossetti, MD       Facility-Administered Medications Ordered in Other Encounters  Medication Dose Route Frequency Provider Last Rate Last Dose  . lactated ringers infusion    Continuous PRN Moshe Salisbury, CRNA       Allergies  Allergen Reactions  . Naproxen     Headache    Social History   Tobacco Use  . Smoking status: Never Smoker  . Smokeless tobacco: Never Used  Substance Use Topics  . Alcohol use:  Yes    Comment: Social drink    Family History  Problem Relation Age of Onset  . Asthma Mother   . Throat cancer Father   . Lung cancer Brother       Review of Systems  Musculoskeletal: Positive for joint pain.  All other systems reviewed and are negative.   Objective:  Physical Exam  Constitutional: She is oriented to person, place, and time. She appears well-developed and well-nourished.  HENT:  Head: Normocephalic and atraumatic.  Eyes: Pupils are equal, round, and reactive to light. EOM are normal.  Neck: Normal range of motion.  Neck supple.  Cardiovascular: Normal rate and regular rhythm.  Respiratory: Effort normal and breath sounds normal.  GI: Soft. Bowel sounds are normal.  Musculoskeletal:     Left hip: She exhibits decreased strength and bony tenderness.  Neurological: She is alert and oriented to person, place, and time.  Skin: Skin is warm and dry.  Psychiatric: She has a normal mood and affect.    Vital signs in last 24 hours: Temp:  [98 F (36.7 C)] 98 F (36.7 C) (07/14 0932) Pulse Rate:  [62] 62 (07/14 0932) Resp:  [18] 18 (07/14 0932) BP: (152)/(87) 152/87 (07/14 0932) SpO2:  [100 %] 100 % (07/14 0932) Weight:  [63.8 kg] 63.8 kg (07/14 0932)   Labs:   Estimated body mass index is 24.91 kg/m as calculated from the following:   Height as of this encounter: 5\' 3"  (1.6 m).   Weight as of this encounter: 63.8 kg.  Imaging Review:  Plain radiographs demonstrate a left hip hemiarthroplasty with arthritis in the acetabulum and questionable loosening of the femoral component.   Assessment/Plan:  Failed left hip hemiarthroplasty  The patient history, physical examination, clinical judgement of the provider and imaging studies are consistent with failure the left hip(s), previous total hip arthroplasty. Revision to a left total hip arthroplasty is deemed medically necessary. The treatment options including medical management, injection therapy and arthroplasty were discussed at length. The risks and benefits of total hip arthroplasty were presented and reviewed. The risks due to aseptic loosening, infection, stiffness, dislocation/subluxation,  thromboembolic complications and other imponderables were discussed.  The patient acknowledged the explanation, agreed to proceed with the plan and consent was signed. Patient is being admitted for inpatient treatment for surgery, pain control, PT, OT, prophylactic antibiotics, VTE prophylaxis, progressive ambulation and ADL's and discharge planning. The  patient is planning to be discharged home with home health services

## 2018-09-18 NOTE — Op Note (Signed)
NAME: Julia Blair, Julia Blair ACCOUNT [id number] DATE OF BIRTH:1945-06-21 FACILITY: [facility] LOCATION: [facility] PHYSICIAN:Margerite Impastato Kerry Fort, MD  OPERATIVE REPORT  DATE OF PROCEDURE:  09/18/2018  PREOPERATIVE DIAGNOSIS:  Painful left hip hemiarthroplasty.  POSTOPERATIVE DIAGNOSIS:  Painful left hip hemiarthroplasty.  PROCEDURE:  Conversion of left hip hemiarthroplasty to total hip arthroplasty with removal of all components of the previous hip and placement of acetabular and femoral components.  IMPLANTS:  [employer] Sector Gription acetabular component size 50 with a single screw, size 32+0 neutral polyethylene liner, size 12 Corail femoral component with standard offset, size 32+5 metal hip ball.  SURGEON:  Lind Guest. Ninfa Linden, MD  ASSISTANT:  Erskine Emery, PA-C  ANESTHESIA:  Spinal.  ANTIBIOTICS:  Two grams IV Ancef.  ESTIMATED BLOOD LOSS:  300 mL.  COMPLICATIONS:  None.  INDICATIONS:  The patient is a 73 year old female who 3 years ago sustained a left hip femoral neck fracture and had a left hip hemiarthroplasty performed through a posterior approach by one of my colleagues in town.  A low-demand femoral component was  placed in the unipolar head.  Over the last 3 years, she has developed thigh pain as well as groin pain.  Imaging studies were consistent with loosening of the femoral component, and I do feel that she has developed some arthritic changes in the  acetabulum.  We have tried conservative treatment for well over a year.  We have worked on pain control as well as hip and leg strengthening.  A 3-phase bone scan was also concerning for prosthetic loosening as well as plain films.  After continued thigh  pain and groin pain, she did wish to proceed with converting her hemiarthroplasty to a total hip.  We had a long and thorough discussion about this.  She understands that there is risk of acute blood loss anemia, nerve and vessel injury, fracture,   infection, DVT, hip dislocation, and implant failure.  She understands her goals are to decrease pain, improve mobility, and overall improve quality of life.  DESCRIPTION OF PROCEDURE:  After informed consent was obtained and appropriate left hip was marked, she was brought to the operating room and sat up on a stretcher where spinal anesthesia was then obtained.  She was then laid in supine position on a  stretcher.  Foley catheter was placed, and both feet had traction boots applied to them.  Next, she was placed supine on the Hana fracture table, the perineal post in place, both legs in in-line skeletal traction devices and no traction applied.  Her  left operative hip was prepped and draped with DuraPrep and sterile drapes.  A time-out was called, and she was identified as correct patient, correct left hip.  We then made an incision just inferior and posterior to the anterior superior iliac spine  and carried this obliquely down the leg.  I wanted to perform this through a direct anterior approach.  We dissected down tensor fascia lata muscle.  Tensor fascia was then divided longitudinally to proceed with that direct anterior approach.  We  identified and cauterized circumflex vessels and identified the hip capsule, opened up the hip capsule, finding no significant joint effusion, but we exposed the unipolar femoral head.  We removed acetabular labrum and other debris around this and were  able to dislocate the hip.  We removed the unipolar head without difficulty.  We then brought the leg down and under using the Hana fracture table, and I was able to assess the  femoral component.  It was loose, and we removed this easily.  Once we  removed it easily, we decided to go ahead and broach on the femur side.  We used the Corail femoral broaching system from [employer] and broached from a size 8 broach up to a size 11.  I felt that there needed to be a little more of a fit and removed some of  the sclerotic  areas around the previous stem.  So we did use a reamer so we could ream distally, and we passed the reamer with a small reamer and going up a few sizes just to get our femoral component down better.  Once I was pleased with getting it  down, attention was turned to the acetabular side.  Once we removed all the remnants of the acetabular labrum and debris, we began reaming under direct visualization from a size 43 reamer in stepwise increments, going up to a size 49 with all reamers  under direct visualization, the last reamer under direct fluoroscopy so I could obtain my depth of reaming, my inclination and anteversion.  I then placed the real DePuy Sector Gription acetabular component size 50 and a single screw and a 36+0 neutral  polyethylene liner for that size acetabular component.  We then went back to our femur and did place our trial size 12 femoral component and a 32+1 hip ball, reduced this in the acetabulum, and we felt like there was good positioning of the stem and  component.  We felt like we needed just a little bit more leg length to tighten her up.  We dislocated the hip and removed the trial components.  I then placed the real Corail femoral component size 12 with standard offset, and we went with a 32+5 metal  hip ball and reduced this in the acetabulum.  We felt we had certainly lengthened her and improved our offset, but she was stable on exam.  We then irrigated the soft tissue with normal saline solution using pulsatile lavage.  We assessed herself again  radiographically and we were pleased.  We closed the joint capsule with interrupted #1 Ethibond suture, followed by running #1 Vicryl to close the tensor fascia, 0 Vicryl was used to close the deep tissue, 2-0 Vicryl was used to close the subcutaneous  tissue, and interrupted staples on the skin.  Xeroform and an Aquacel dressing were applied.  She was taken off the Hana table and taken to recovery room in stable condition.  All final  counts were correct.  There were no complications noted.  Of note,  Benita Stabile, PA-C, assisted the entire case.  His assistance was crucial for facilitating all aspects of this case.  LN/NUANCE  D:09/18/2018 T:09/18/2018 JOB:[id number]

## 2018-09-18 NOTE — Brief Op Note (Signed)
09/18/2018  2:16 PM  PATIENT:  Julia Blair  73 y.o. female  PRE-OPERATIVE DIAGNOSIS:  Failed/Painful Left Hip Hemiarthroplasty  POST-OPERATIVE DIAGNOSIS:  Failed/Painful Left Hip Hemiarthroplasty  PROCEDURE:  Procedure(s): Conversion of Left Hip Hemiarthroplasty to Total Hip (Left)  SURGEON:  Surgeon(s) and Role:    Mcarthur Rossetti, MD - Primary  PHYSICIAN ASSISTANT: Benita Stabile, PA-C  ANESTHESIA:   spinal  EBL:  300 mL   COUNTS:  YES  DICTATION: .Other Dictation: Dictation Number [id number]  PLAN OF CARE: Admit to inpatient   PATIENT DISPOSITION:  PACU - hemodynamically stable.   Delay start of Pharmacological VTE agent (>24hrs) due to surgical blood loss or risk of bleeding: no

## 2018-09-19 ENCOUNTER — Encounter (HOSPITAL_COMMUNITY): Payer: Self-pay | Admitting: Orthopaedic Surgery

## 2018-09-19 LAB — BASIC METABOLIC PANEL
Anion gap: 6 (ref 5–15)
BUN: 6 mg/dL — ABNORMAL LOW (ref 8–23)
CO2: 25 mmol/L (ref 22–32)
Calcium: 8.5 mg/dL — ABNORMAL LOW (ref 8.9–10.3)
Chloride: 107 mmol/L (ref 98–111)
Creatinine, Ser: 0.61 mg/dL (ref 0.44–1.00)
GFR calc Af Amer: >60 mL/min (ref >60)
GFR calc non Af Amer: >60 mL/min (ref >60)
Glucose, Bld: 105 mg/dL — ABNORMAL HIGH (ref 70–99)
Potassium: 4.3 mmol/L (ref 3.5–5.1)
Sodium: 138 mmol/L (ref 135–145)

## 2018-09-19 LAB — CBC
HCT: 31 % — ABNORMAL LOW (ref 36.0–46.0)
Hemoglobin: 10.2 g/dL — ABNORMAL LOW (ref 12.0–15.0)
MCH: 30.1 pg (ref 26.0–34.0)
MCHC: 32.9 g/dL (ref 30.0–36.0)
MCV: 91.4 fL (ref 80.0–100.0)
Platelets: 199 10*3/uL (ref 150–400)
RBC: 3.39 MIL/uL — ABNORMAL LOW (ref 3.87–5.11)
RDW: 12.9 % (ref 11.5–15.5)
WBC: 9.9 10*3/uL (ref 4.0–10.5)
nRBC: 0 % (ref 0.0–0.2)

## 2018-09-19 NOTE — Discharge Instructions (Signed)

## 2018-09-19 NOTE — Progress Notes (Signed)
Subjective: 1 Day Post-Op Procedure(s) (LRB): Conversion of Left Hip Hemiarthroplasty to Total Hip (Left) Patient reports pain as moderate.    Objective: Vital signs in last 24 hours: Temp:  [97.3 F (36.3 C)-98.1 F (36.7 C)] 97.8 F (36.6 C) (07/15 0533) Pulse Rate:  [55-82] 55 (07/15 0533) Resp:  [13-18] 17 (07/15 0533) BP: (97-152)/(56-87) 112/66 (07/15 0533) SpO2:  [95 %-100 %] 100 % (07/15 0533) Weight:  [63.8 kg] 63.8 kg (07/14 0932)  Intake/Output from previous day: 07/14 0701 - 07/15 0700 In: 2580.2 [P.O.:360; I.V.:2220.2] Out: 2905 [Urine:2605; Blood:300] Intake/Output this shift: No intake/output data recorded.  Recent Labs    09/19/18 0355  HGB 10.2*   Recent Labs    09/19/18 0355  WBC 9.9  RBC 3.39*  HCT 31.0*  PLT 199   Recent Labs    09/19/18 0355  NA 138  K 4.3  CL 107  CO2 25  BUN 6*  CREATININE 0.61  GLUCOSE 105*  CALCIUM 8.5*   No results for input(s): LABPT, INR in the last 72 hours.  Sensation intact distally Intact pulses distally Dorsiflexion/Plantar flexion intact Incision: dressing C/D/I   Assessment/Plan: 1 Day Post-Op Procedure(s) (LRB): Conversion of Left Hip Hemiarthroplasty to Total Hip (Left) Up with therapy Plan for discharge tomorrow Discharge home with home health      Mcarthur Rossetti 09/19/2018, 8:02 AM

## 2018-09-19 NOTE — Progress Notes (Signed)
Physical Therapy Treatment Patient Details Name: Julia Blair MRN: [id number] DOB: [birth year] Today's Date: 09/19/2018    History of Present Illness Pt is a 73 y/o female s/p L total hip revision secondary to painful L hemiarthroplasty components. PMH includes L hemiarthroplasty.     PT Comments    Pt performed pm session with progression to step through sequencing and standing exercises.  She remains limited due to pain.  Pt seated in recliner post session with ice pack applied to L hip.      Follow Up Recommendations  Follow surgeon's recommendation for DC plan and follow-up therapies;Supervision for mobility/OOB     Equipment Recommendations  None recommended by PT    Recommendations for Other Services       Precautions / Restrictions Precautions Precautions: Fall Restrictions Weight Bearing Restrictions: Yes LLE Weight Bearing: Weight bearing as tolerated    Mobility  Bed Mobility Overal bed mobility: Needs Assistance Bed Mobility: Supine to Sit     Supine to sit: Supervision     General bed mobility comments: Increased time and effort to come to edge of bed but no assistance to perform.  Transfers Overall transfer level: Needs assistance Equipment used: Rolling walker (2 wheeled) Transfers: Sit to/from Stand Sit to Stand: Supervision         General transfer comment: Cues for safety to make sure RW is present before standing.  Ambulation/Gait Ambulation/Gait assistance: Min guard Gait Distance (Feet): 120 Feet Assistive device: Rolling walker (2 wheeled) Gait Pattern/deviations: Step-to pattern;Decreased step length - right;Decreased step length - left;Decreased weight shift to left;Antalgic;Step-through pattern     General Gait Details: Slow, antalgic gait. Cues for sequencing using RW. Pt with increased pain, Cues for progression to continuous step through pattern.   Stairs             Wheelchair Mobility    Modified Rankin (Stroke  Patients Only)       Balance Overall balance assessment: Needs assistance Sitting-balance support: No upper extremity supported;Feet supported Sitting balance-Leahy Scale: Good       Standing balance-Leahy Scale: Poor                              Cognition Arousal/Alertness: Awake/alert Behavior During Therapy: WFL for tasks assessed/performed Overall Cognitive Status: Within Functional Limits for tasks assessed                                        Exercises Total Joint Exercises Hip ABduction/ADduction: AROM;Left;10 reps;Standing Knee Flexion: AROM;Left;10 reps;Standing Marching in Standing: AROM;Left;10 reps;Standing Standing Hip Extension: AROM;Left;10 reps;Standing    General Comments        Pertinent Vitals/Pain Pain Assessment: 0-10 Pain Score: 7  Pain Location: L hip Pain Descriptors / Indicators: Aching;Operative site guarding Pain Intervention(s): Premedicated before session;Ice applied(po given but not working yet)    Home Living                      Prior Function            PT Goals (current goals can now be found in the care plan section) Acute Rehab PT Goals Patient Stated Goal: for pain to improve Potential to Achieve Goals: Good Progress towards PT goals: Progressing toward goals    Frequency    7X/week  PT Plan Current plan remains appropriate    Co-evaluation              AM-PAC PT "6 Clicks" Mobility   Outcome Measure  Help needed turning from your back to your side while in a flat bed without using bedrails?: A Little Help needed moving from lying on your back to sitting on the side of a flat bed without using bedrails?: A Little Help needed moving to and from a bed to a chair (including a wheelchair)?: A Little Help needed standing up from a chair using your arms (e.g., wheelchair or bedside chair)?: A Little Help needed to walk in hospital room?: A Little Help needed climbing  3-5 steps with a railing? : A Little 6 Click Score: 18    End of Session Equipment Utilized During Treatment: Gait belt Activity Tolerance: Patient limited by pain Patient left: in chair;with call bell/phone within reach Nurse Communication: Mobility status PT Visit Diagnosis: Muscle weakness (generalized) (M62.81);Other abnormalities of gait and mobility (R26.89);Pain Pain - Right/Left: Left Pain - part of body: Hip     Time: 3662-9476 PT Time Calculation (min) (ACUTE ONLY): 24 min  Charges:  $Gait Training: 8-22 mins $Therapeutic Exercise: 8-22 mins                     Governor Rooks, PTA Acute Rehabilitation Services Pager [phone] Office [phone]     Fabrice Dyal Eli Hose 09/19/2018, 4:40 PM

## 2018-09-19 NOTE — Anesthesia Postprocedure Evaluation (Signed)
Anesthesia Post Note  Patient: Julia Blair  Procedure(s) Performed: Conversion of Left Hip Hemiarthroplasty to Total Hip (Left Hip)     Patient location during evaluation: PACU Anesthesia Type: Spinal Level of consciousness: oriented and awake and alert Pain management: pain level controlled Vital Signs Assessment: post-procedure vital signs reviewed and stable Respiratory status: spontaneous breathing, respiratory function stable and patient connected to nasal cannula oxygen Cardiovascular status: blood pressure returned to baseline and stable Postop Assessment: no headache, no backache and no apparent nausea or vomiting Anesthetic complications: no    Last Vitals:  Vitals:   09/19/18 0533 09/19/18 0826  BP: 112/66 (!) 85/61  Pulse: (!) 55 61  Resp: 17 16  Temp: 36.6 C 36.7 C  SpO2: 100% 98%    Last Pain:  Vitals:   09/19/18 0826  TempSrc: Oral  PainSc:                  Antonito S

## 2018-09-19 NOTE — Progress Notes (Signed)
Physical Therapy Treatment Patient Details Name: Julia Blair MRN: [id number] DOB: March 21, 1945 Today's Date: 09/19/2018    History of Present Illness Pt is a 73 y/o female s/p L total hip revision secondary to painful L hemiarthroplasty components. PMH includes L hemiarthroplasty.     PT Comments    Pt performed gt training and functional mobility during session this am.  Pt in increased pain.  Pt is slow and guarded due to pain with antalgic gt pattern.  Pt performed LE strengthening exercises this session.  Plan to follow up in pm for further progression of functional mobility.      Follow Up Recommendations  Follow surgeon's recommendation for DC plan and follow-up therapies;Supervision for mobility/OOB     Equipment Recommendations  None recommended by PT    Recommendations for Other Services       Precautions / Restrictions Precautions Precautions: Fall Restrictions Weight Bearing Restrictions: Yes LLE Weight Bearing: Weight bearing as tolerated    Mobility  Bed Mobility Overal bed mobility: Needs Assistance Bed Mobility: Supine to Sit;Sit to Supine     Supine to sit: Supervision     General bed mobility comments: Increased time and effort to come to edge of bed but no assistance to perform.  Transfers Overall transfer level: Needs assistance Equipment used: Rolling walker (2 wheeled) Transfers: Sit to/from Stand Sit to Stand: Min guard         General transfer comment: Pt performed sit to stand with cues for hand palcement.  Min guard for safety.  Ambulation/Gait Ambulation/Gait assistance: Min guard Gait Distance (Feet): 150 Feet Assistive device: Rolling walker (2 wheeled) Gait Pattern/deviations: Step-to pattern;Decreased step length - right;Decreased step length - left;Decreased weight shift to left;Antalgic Gait velocity: Decreased    General Gait Details: Slow, antalgic gait. Cues for sequencing using RW. Pt with increased pain, She was able to  progress gt distance.   Stairs             Wheelchair Mobility    Modified Rankin (Stroke Patients Only)       Balance Overall balance assessment: Needs assistance Sitting-balance support: No upper extremity supported;Feet supported Sitting balance-Leahy Scale: Good     Standing balance support: Bilateral upper extremity supported;During functional activity Standing balance-Leahy Scale: Poor                              Cognition Arousal/Alertness: Awake/alert Behavior During Therapy: WFL for tasks assessed/performed Overall Cognitive Status: Within Functional Limits for tasks assessed                                        Exercises Total Joint Exercises Ankle Circles/Pumps: AROM;Both;20 reps;Supine Quad Sets: AROM;Left;10 reps;Supine Heel Slides: Left;10 reps;Supine;AAROM Hip ABduction/ADduction: AROM;Supine;Left;10 reps Long Arc Quad: AROM;Left;10 reps;Seated    General Comments        Pertinent Vitals/Pain Pain Assessment: Faces Faces Pain Scale: Hurts whole lot Pain Location: L hip Pain Descriptors / Indicators: Aching;Operative site guarding Pain Intervention(s): Monitored during session;Repositioned;RN gave pain meds during session;Patient requesting pain meds-RN notified    Home Living                      Prior Function            PT Goals (current goals can now be found in the care  plan section) Acute Rehab PT Goals Patient Stated Goal: for pain to improve Potential to Achieve Goals: Good    Frequency    7X/week      PT Plan Current plan remains appropriate    Co-evaluation              AM-PAC PT "6 Clicks" Mobility   Outcome Measure  Help needed turning from your back to your side while in a flat bed without using bedrails?: A Little Help needed moving from lying on your back to sitting on the side of a flat bed without using bedrails?: A Little Help needed moving to and from a bed  to a chair (including a wheelchair)?: A Little Help needed standing up from a chair using your arms (e.g., wheelchair or bedside chair)?: A Little Help needed to walk in hospital room?: A Little Help needed climbing 3-5 steps with a railing? : A Little 6 Click Score: 18    End of Session Equipment Utilized During Treatment: Gait belt Activity Tolerance: Patient limited by pain Patient left: in chair;with call bell/phone within reach Nurse Communication: Mobility status PT Visit Diagnosis: Muscle weakness (generalized) (M62.81);Other abnormalities of gait and mobility (R26.89);Pain Pain - Right/Left: Left Pain - part of body: Hip     Time: 2248-2500 PT Time Calculation (min) (ACUTE ONLY): 31 min  Charges:  $Gait Training: 8-22 mins $Therapeutic Exercise: 8-22 mins                     Governor Rooks, PTA Acute Rehabilitation Services Pager [phone] Office [phone]     Carthel Castille Eli Hose 09/19/2018, 11:46 AM

## 2018-09-20 ENCOUNTER — Encounter (HOSPITAL_COMMUNITY): Payer: Self-pay | Admitting: General Practice

## 2018-09-20 LAB — CBC
HCT: 28.8 % — ABNORMAL LOW (ref 36.0–46.0)
Hemoglobin: 9.1 g/dL — ABNORMAL LOW (ref 12.0–15.0)
MCH: 29.4 pg (ref 26.0–34.0)
MCHC: 31.6 g/dL (ref 30.0–36.0)
MCV: 92.9 fL (ref 80.0–100.0)
Platelets: 181 10*3/uL (ref 150–400)
RBC: 3.1 MIL/uL — ABNORMAL LOW (ref 3.87–5.11)
RDW: 13.2 % (ref 11.5–15.5)
WBC: 9.8 10*3/uL (ref 4.0–10.5)
nRBC: 0 % (ref 0.0–0.2)

## 2018-09-20 MED ORDER — ASPIRIN 81 MG PO CHEW: 81.0000 mg | CHEWABLE_TABLET | Freq: Two times a day (BID) | ORAL | 0 refills | Status: AC

## 2018-09-20 MED ORDER — HYDROCODONE-ACETAMINOPHEN 5-325 MG PO TABS: 1.0000 | ORAL_TABLET | Freq: Four times a day (QID) | ORAL | 0 refills | Status: DC | PRN

## 2018-09-20 NOTE — Progress Notes (Signed)
Patient ID: Julia Blair, female   DOB: 1945/09/17, 73 y.o.   MRN: [id number] Doing well overall.  Vitals stable.  Slight acute blood loss anemia, but asymptomatic.  Left hip stable.  Can be discharged to home today.

## 2018-09-20 NOTE — Discharge Summary (Signed)
Patient ID: Julia Blair MRN: [id number] DOB/AGE: Apr 22, 1945 73 y.o.  Admit date: 09/18/2018 Discharge date: 09/20/2018  Admission Diagnoses:  Principal Problem:   Aseptic loosening of prosthetic hip, sequela Active Problems:   Status post revision of total hip   Discharge Diagnoses:  Same  Past Medical History:  Diagnosis Date  . Arthritis   . Bladder incontinence   . High cholesterol     Surgeries: Procedure(s): Conversion of Left Hip Hemiarthroplasty to Total Hip on 09/18/2018   Consultants:   Discharged Condition: Improved  Hospital Course: Julia Blair is an 73 y.o. female who was admitted 09/18/2018 for operative treatment ofAseptic loosening of prosthetic hip, sequela. Patient has severe unremitting pain that affects sleep, daily activities, and work/hobbies. After pre-op clearance the patient was taken to the operating room on 09/18/2018 and underwent  Procedure(s): Conversion of Left Hip Hemiarthroplasty to Total Hip.    Patient was given perioperative antibiotics:  Anti-infectives (From admission, onward)   Start     Dose/Rate Route Frequency Ordered Stop   09/18/18 1600  ceFAZolin (ANCEF) IVPB 1 g/50 mL premix     1 g 100 mL/hr over 30 Minutes Intravenous Every 6 hours 09/18/18 1556 09/18/18 2105   09/18/18 0919  ceFAZolin (ANCEF) 2-4 GM/100ML-% IVPB    Note to Pharmacy: Ernesta Amble   : cabinet override      09/18/18 0919 09/18/18 1155   09/18/18 0915  ceFAZolin (ANCEF) IVPB 2g/100 mL premix     2 g 200 mL/hr over 30 Minutes Intravenous On call to O.R. 09/18/18 0911 09/18/18 1155       Patient was given sequential compression devices, early ambulation, and chemoprophylaxis to prevent DVT.  Patient benefited maximally from hospital stay and there were no complications.    Recent vital signs:  Patient Vitals for the past 24 hrs:  BP Temp Temp src Pulse Resp SpO2  09/20/18 0340 (!) 115/55 98.3 F (36.8 C) Oral (!) 56 16 98 %  09/19/18 1933 109/65  98.4 F (36.9 C) Oral 65 16 95 %  09/19/18 1455 116/64 98.4 F (36.9 C) Oral 64 16 100 %  09/19/18 0826 (!) 85/61 98.1 F (36.7 C) Oral 61 16 98 %     Recent laboratory studies:  Recent Labs    09/19/18 0355 09/20/18 0422  WBC 9.9 9.8  HGB 10.2* 9.1*  HCT 31.0* 28.8*  PLT 199 181  NA 138  --   K 4.3  --   CL 107  --   CO2 25  --   BUN 6*  --   CREATININE 0.61  --   GLUCOSE 105*  --   CALCIUM 8.5*  --      Discharge Medications:   Allergies as of 09/20/2018      Reactions   Naproxen    Headache      Medication List    STOP taking these medications   diclofenac sodium 1 % Gel Commonly known as: Voltaren   nabumetone 500 MG tablet Commonly known as: RELAFEN     TAKE these medications   aspirin 81 MG chewable tablet Chew 1 tablet (81 mg total) by mouth 2 (two) times daily.   aspirin-acetaminophen-caffeine 250-250-65 MG tablet Commonly known as: EXCEDRIN MIGRAINE Take 2 tablets by mouth every 6 (six) hours as needed for headache or migraine.   CALCIUM 600/VITAMIN D PO Take 1 tablet by mouth daily.   Co Q 10 100 MG Caps Take 100 mg by mouth daily.  glucosamine-chondroitin 500-400 MG tablet Take 1 tablet by mouth daily.   HYDROcodone-acetaminophen 5-325 MG tablet Commonly known as: NORCO/VICODIN Take 1 tablet by mouth every 6 (six) hours as needed for moderate pain.   Melatonin 10 MG Tabs Take 10 mg by mouth at bedtime as needed (sleep).   multivitamin tablet Take 1 tablet by mouth daily.   polyethylene glycol 17 g packet Commonly known as: MIRALAX / GLYCOLAX Take 17 g by mouth once a week.   Red Yeast Rice 600 MG Caps Take 600 mg by mouth 2 (two) times a day.            Durable Medical Equipment  (From admission, onward)         Start     Ordered   09/18/18 1557  DME Walker rolling  Once    Question:  Patient needs a walker to treat with the following condition  Answer:  Status post revision of total hip   09/18/18 1556   09/18/18  1557  DME 3 n 1  Once     09/18/18 1556          Diagnostic Studies: Dg Pelvis Portable  Result Date: 09/18/2018 CLINICAL DATA:  Status post revision of LEFT total hip. EXAM: PORTABLE PELVIS 1-2 VIEWS COMPARISON:  None. FINDINGS: LEFT hip arthroplasty hardware in place. Hardware appears intact and appropriately positioned. Osseous alignment appears anatomic. Expected postsurgical changes within the surrounding soft tissues. IMPRESSION: Status post LEFT hip arthroplasty revision. Hardware appears intact and appropriately positioned. No evidence of surgical complicating feature. Electronically Signed   By: Franki Cabot M.D.   On: 09/18/2018 14:50   Dg C-arm 1-60 Min  Result Date: 09/18/2018 CLINICAL DATA:  Revision of left hip replacement EXAM: DG C-ARM 61-120 MIN; OPERATIVE LEFT HIP WITH PELVIS COMPARISON:  35 seconds FLUOROSCOPY TIME:  Fluoroscopy Time:  35 seconds Radiation Exposure Index (if provided by the fluoroscopic device): 2.4 mGy Number of Acquired Spot Images: 3 FINDINGS: Left hip prosthesis is noted in satisfactory position. No acute bony or soft tissue abnormality is noted. IMPRESSION: Status post left hip replacement. Electronically Signed   By: Inez Catalina M.D.   On: 09/18/2018 14:16   Dg Hip Operative Unilat W Or W/o Pelvis Left  Result Date: 09/18/2018 CLINICAL DATA:  Revision of left hip replacement EXAM: DG C-ARM 61-120 MIN; OPERATIVE LEFT HIP WITH PELVIS COMPARISON:  35 seconds FLUOROSCOPY TIME:  Fluoroscopy Time:  35 seconds Radiation Exposure Index (if provided by the fluoroscopic device): 2.4 mGy Number of Acquired Spot Images: 3 FINDINGS: Left hip prosthesis is noted in satisfactory position. No acute bony or soft tissue abnormality is noted. IMPRESSION: Status post left hip replacement. Electronically Signed   By: Inez Catalina M.D.   On: 09/18/2018 14:16    Disposition: Discharge disposition: 01-Home or Self Care         Follow-up Information    Mcarthur Rossetti, MD. Schedule an appointment as soon as possible for a visit in 2 week(s).   Specialty: Orthopedic Surgery Contact information: [place] [address] [phone]            Signed: Mcarthur Rossetti 09/20/2018, 7:07 AM

## 2018-09-20 NOTE — Progress Notes (Signed)
Physical Therapy Treatment Patient Details Name: Julia Blair MRN: [id number] DOB: [birth year] Today's Date: 09/20/2018    History of Present Illness Pt is a 73 y/o female s/p L total hip revision secondary to painful L hemiarthroplasty components. PMH includes L hemiarthroplasty.     PT Comments    Pt performed gait training and toileting during session.  Plan for d/c home today.  Pt performing step through pattern but gt remains antalgic.  Issued HEP and educated on home use.  Informed nursing patient ready to d/c from a mobility stand point.      Follow Up Recommendations  Follow surgeon's recommendation for DC plan and follow-up therapies;Supervision for mobility/OOB     Equipment Recommendations  None recommended by PT    Recommendations for Other Services       Precautions / Restrictions Precautions Precautions: Fall Restrictions Weight Bearing Restrictions: Yes LLE Weight Bearing: Weight bearing as tolerated    Mobility  Bed Mobility Overal bed mobility: Needs Assistance Bed Mobility: Supine to Sit     Supine to sit: Supervision     General bed mobility comments: Increased time and effort to come to edge of bed but no assistance to perform.  Transfers Overall transfer level: Modified independent Equipment used: Rolling walker (2 wheeled) Transfers: Sit to/from Stand Sit to Stand: Modified independent (Device/Increase time)            Ambulation/Gait Ambulation/Gait assistance: Supervision Gait Distance (Feet): 120 Feet Assistive device: Rolling walker (2 wheeled) Gait Pattern/deviations: Step-to pattern;Decreased step length - right;Decreased step length - left;Decreased weight shift to left;Antalgic;Step-through pattern Gait velocity: Decreased    General Gait Details: Slow, antalgic gait. Cues for sequencing using RW. Pt with increased pain, Cues for progression to continuous step through pattern.   Stairs             Wheelchair  Mobility    Modified Rankin (Stroke Patients Only)       Balance Overall balance assessment: Needs assistance Sitting-balance support: No upper extremity supported;Feet supported Sitting balance-Leahy Scale: Good     Standing balance support: Bilateral upper extremity supported;During functional activity Standing balance-Leahy Scale: Poor Standing balance comment: Reliant on BUE support                             Cognition Arousal/Alertness: Awake/alert Behavior During Therapy: WFL for tasks assessed/performed Overall Cognitive Status: Within Functional Limits for tasks assessed                                        Exercises      General Comments        Pertinent Vitals/Pain Pain Assessment: 0-10 Pain Score: 9  Pain Location: L hip Pain Descriptors / Indicators: Aching;Operative site guarding Pain Intervention(s): Premedicated before session;RN gave pain meds during session    Home Living                      Prior Function            PT Goals (current goals can now be found in the care plan section) Acute Rehab PT Goals Patient Stated Goal: for pain to improve PT Goal Formulation: With patient Potential to Achieve Goals: Good    Frequency    7X/week      PT Plan Current plan remains appropriate  Co-evaluation              AM-PAC PT "6 Clicks" Mobility   Outcome Measure  Help needed turning from your back to your side while in a flat bed without using bedrails?: A Little Help needed moving from lying on your back to sitting on the side of a flat bed without using bedrails?: A Little Help needed moving to and from a bed to a chair (including a wheelchair)?: A Little Help needed standing up from a chair using your arms (e.g., wheelchair or bedside chair)?: A Little Help needed to walk in hospital room?: A Little Help needed climbing 3-5 steps with a railing? : A Little 6 Click Score: 18    End of  Session Equipment Utilized During Treatment: Gait belt Activity Tolerance: Patient limited by pain Patient left: in chair;with call bell/phone within reach Nurse Communication: Mobility status PT Visit Diagnosis: Muscle weakness (generalized) (M62.81);Other abnormalities of gait and mobility (R26.89);Pain Pain - Right/Left: Left Pain - part of body: Hip     Time: 9470-7615 PT Time Calculation (min) (ACUTE ONLY): 27 min  Charges:  $Gait Training: 8-22 mins $Therapeutic Activity: 8-22 mins                     Governor Rooks, PTA Acute Rehabilitation Services Pager [phone] Office [phone]     Jhoanna Heyde Eli Hose 09/20/2018, 3:52 PM

## 2018-09-20 NOTE — Progress Notes (Signed)
Pt stated she has called her sister to pick her up and that RN does not need to call her son to update on pt status because "I have already done so". Will continue to monitor.

## 2018-09-20 NOTE — Progress Notes (Signed)
AVS given and reviewed with pt. Medications discussed and reviewed. All questions answered to satisfaction. Pt verbalized understanding of information given. Awaiting pt's transportation. Will continue to monitor.

## 2018-09-23 DIAGNOSIS — E785 Hyperlipidemia, unspecified: Secondary | ICD-10-CM | POA: Diagnosis not present

## 2018-09-23 DIAGNOSIS — T84031D Mechanical loosening of internal left hip prosthetic joint, subsequent encounter: Secondary | ICD-10-CM | POA: Diagnosis not present

## 2018-09-24 ENCOUNTER — Telehealth: Payer: Self-pay | Admitting: Orthopaedic Surgery

## 2018-09-24 NOTE — Telephone Encounter (Signed)
Received call from Verdis Frederickson (PT) with [facility] needing verbal orders for HHPT 3 Wk 1 and Twice a week for 1 Wk. The number to contact Verdis Frederickson is [phone]

## 2018-09-24 NOTE — Telephone Encounter (Signed)
I called Verdis Frederickson and gave verbal.

## 2018-09-26 DIAGNOSIS — T84031D Mechanical loosening of internal left hip prosthetic joint, subsequent encounter: Secondary | ICD-10-CM | POA: Diagnosis not present

## 2018-09-26 DIAGNOSIS — E785 Hyperlipidemia, unspecified: Secondary | ICD-10-CM | POA: Diagnosis not present

## 2018-09-28 DIAGNOSIS — E785 Hyperlipidemia, unspecified: Secondary | ICD-10-CM | POA: Diagnosis not present

## 2018-09-28 DIAGNOSIS — T84031D Mechanical loosening of internal left hip prosthetic joint, subsequent encounter: Secondary | ICD-10-CM | POA: Diagnosis not present

## 2018-10-01 DIAGNOSIS — E785 Hyperlipidemia, unspecified: Secondary | ICD-10-CM | POA: Diagnosis not present

## 2018-10-01 DIAGNOSIS — T84031D Mechanical loosening of internal left hip prosthetic joint, subsequent encounter: Secondary | ICD-10-CM | POA: Diagnosis not present

## 2018-10-02 ENCOUNTER — Ambulatory Visit (INDEPENDENT_AMBULATORY_CARE_PROVIDER_SITE_OTHER): Payer: Medicare Other | Admitting: Orthopaedic Surgery

## 2018-10-02 ENCOUNTER — Encounter: Payer: Self-pay | Admitting: Orthopaedic Surgery

## 2018-10-02 DIAGNOSIS — T84038S Mechanical loosening of other internal prosthetic joint, sequela: Secondary | ICD-10-CM

## 2018-10-02 DIAGNOSIS — M25562 Pain in left knee: Secondary | ICD-10-CM

## 2018-10-02 DIAGNOSIS — Z96649 Presence of unspecified artificial hip joint: Secondary | ICD-10-CM

## 2018-10-02 MED ORDER — HYDROCODONE-ACETAMINOPHEN 5-325 MG PO TABS: 1.0000 | ORAL_TABLET | Freq: Four times a day (QID) | ORAL | 0 refills | Status: DC | PRN

## 2018-10-02 NOTE — Progress Notes (Signed)
The patient is 2 weeks today status post conversion of a left hip hemiarthroplasty to a total hip replacement.  She is only 73 years old and had sustained a femoral neck fracture several years ago.  She had a hemiarthroplasty placed a posterior approach by 1 of my partners.  Over time she developed arthritis in the hip joint itself.  Also the femoral component was loose.  It was a low demand femoral component.  We took to the operating room 2 weeks ago we went to direct anterior approach and removed the implants in place a total hip arthroplasty.  She is ambulating with just a cane and doing well overall.  She does feel like her left knee is weak and is requesting a knee brace today.  She has been on aspirin twice a day and reports minimal pain.  She is only been on some hydrocodone and she would like a least one refill.  Home health therapy is been coming to her house.  She denies any calf pain.  She is walking with just a cane.  On exam her left hip incision looks good.  I remove the staples and place Steri-Strips.  Her knee appears stable on exam but we will put her in a hinged knee brace for just stability and comfort purposes.  She can get her incision wet in the shower start tomorrow.  She will let the Steri-Strips fall off on their own.  She can stop her twice daily aspirin.  She will increase her activities as comfort allows.  We will see her back in 4 weeks to see how she is doing overall but no x-rays are needed.  All question concerns were otherwise answered and addressed.

## 2018-10-03 DIAGNOSIS — T84031D Mechanical loosening of internal left hip prosthetic joint, subsequent encounter: Secondary | ICD-10-CM | POA: Diagnosis not present

## 2018-10-03 DIAGNOSIS — E785 Hyperlipidemia, unspecified: Secondary | ICD-10-CM | POA: Diagnosis not present

## 2018-10-30 ENCOUNTER — Encounter: Payer: Self-pay | Admitting: Orthopaedic Surgery

## 2018-10-30 ENCOUNTER — Ambulatory Visit (INDEPENDENT_AMBULATORY_CARE_PROVIDER_SITE_OTHER): Payer: Medicare Other | Admitting: Orthopaedic Surgery

## 2018-10-30 DIAGNOSIS — Z96649 Presence of unspecified artificial hip joint: Secondary | ICD-10-CM

## 2018-10-30 NOTE — Progress Notes (Signed)
The patient is now starting her 6-week status post a left hip revision arthroplasty.  She had a previous hemiarthroplasty was done about 2 to 3 years ago by 1 of my partners.  The femoral component was loose and she continued to have groin pain as well.  At the time of surgery through an anterior approach were able to remove the hemiarthroplasty and convert this to a total hip arthroplasty with replacing the acetabular component and the femoral component.  She is doing much better overall.  She only reports deconditioning and weakness from what she has dealt with over the last 2 years.  She is very active overall.  She has not walking with any type of assistive device.  She is Artie been through home therapy.  She would rather not get outpatient physical therapy due to the COVID-19 pandemic.  On exam I can easily put her left hip the range of motion with minimal discomfort.  Her incisions healed nicely.  She does have some weak hip flexors but this is improving from when I first saw her.  At this point should continue increase her activities as she tolerates.  She does have home exercises that she will continue to do to get stronger.  We will see her back in 3 months.  At that visit I would like an AP pelvis and lateral of her left operative hip.

## 2018-12-27 DIAGNOSIS — Z23 Encounter for immunization: Secondary | ICD-10-CM | POA: Diagnosis not present

## 2019-01-22 ENCOUNTER — Encounter: Payer: Self-pay | Admitting: Orthopaedic Surgery

## 2019-01-22 ENCOUNTER — Ambulatory Visit: Payer: Medicare Other | Admitting: Orthopaedic Surgery

## 2019-01-22 ENCOUNTER — Ambulatory Visit: Payer: Self-pay

## 2019-01-22 DIAGNOSIS — Z96642 Presence of left artificial hip joint: Secondary | ICD-10-CM

## 2019-01-22 DIAGNOSIS — Z96649 Presence of unspecified artificial hip joint: Secondary | ICD-10-CM

## 2019-01-22 NOTE — Progress Notes (Signed)
Office Visit Note   Patient: Julia Blair           Date of Birth: 1945-03-25           MRN: [id number] Visit Date: 01/22/2019              Requested by: Caryl Bis, MD [address],  [employer] [place] [address] PCP: Caryl Bis, MD   Assessment & Plan: Visit Diagnoses:  1. History of left hip hemiarthroplasty   2. Status post revision of total hip     Plan: She will continue to work on range of motion strengthening hip.  Follow-up with [place] at 1 year postop at that time we will obtain AP pelvis and lateral view of the left hip.  Follow-Up Instructions: Return in about 8 months (around 09/21/2019) for Radiographs.   Orders:  Orders Placed This Encounter  Procedures  . XR HIP UNILAT W OR W/O PELVIS 2-3 VIEWS LEFT   No orders of the defined types were placed in this encounter.     Procedures: No procedures performed   Clinical Data: No additional findings.   Subjective: Chief Complaint  Patient presents with  . Left Hip - Routine Post Op    HPI Julia Blair returns today 4 months status post conversion of left hip hemiarthroplasty to left total hip arthroplasty.  She states she is doing great.  She is finished physical therapy.  She states that her back pain is gone.  She denies any hip pain. Very happy with the results. Review of Systems Negative for fevers chills shortness of breath chest pain  Objective: Vital Signs: There were no vitals taken for this visit.  Physical Exam Constitutional:      Appearance: She is not ill-appearing or diaphoretic.  Pulmonary:     Effort: Pulmonary effort is normal.  Neurological:     Mental Status: She is alert and oriented to person, place, and time.  Psychiatric:        Mood and Affect: Mood normal.     Ortho Exam Left hip good range of motion without pain.  Dorsiflexion plantarflexion left ankle intact.  Left calf supple nontender. Specialty Comments:  No specialty comments available.  Imaging: Xr Hip Unilat W  Or W/o Pelvis 2-3 Views Left  Result Date: 01/22/2019 AP pelvis lateral view left hip: No acute fractures.  Status post left total hip arthroplasty with no complicating features components appear well seated.    PMFS History: Patient Active Problem List   Diagnosis Date Noted  . Aseptic loosening of prosthetic hip, sequela 09/18/2018  . Status post revision of total hip 09/18/2018  . History of left hip hemiarthroplasty 08/01/2017  . Trochanteric bursitis, left hip 12/06/2016  . Pain in left hip 07/25/2016  . Unsteadiness on feet   . HLD (hyperlipidemia) 12/13/2015  . Fall 12/13/2015  . Leukocytopenia 12/13/2015  . Closed left hip fracture, initial encounter ([place]) 12/13/2015   Past Medical History:  Diagnosis Date  . Arthritis   . Bladder incontinence   . High cholesterol     Family History  Problem Relation Age of Onset  . Asthma Mother   . Throat cancer Father   . Lung cancer Brother     Past Surgical History:  Procedure Laterality Date  . ABDOMINAL HYSTERECTOMY    . ANTERIOR HIP REVISION Left 09/18/2018   Procedure: Conversion of Left Hip Hemiarthroplasty to Total Hip;  Surgeon: Mcarthur Rossetti, MD;  Location: [facility];  Service: Orthopedics;  Laterality: Left;  . CYST EXCISION Right    right arm  . HIP ARTHROPLASTY Left 12/14/2015   Procedure: ARTHROPLASTY BIPOLAR HIP (HEMIARTHROPLASTY);  Surgeon: Newt Minion, MD;  Location: [facility];  Service: Orthopedics;  Laterality: Left;   Social History   Occupational History  . Not on file  Tobacco Use  . Smoking status: Never Smoker  . Smokeless tobacco: Never Used  Substance and Sexual Activity  . Alcohol use: Yes    Comment: Social drink  . Drug use: No  . Sexual activity: Not on file

## 2019-09-06 DIAGNOSIS — Z1389 Encounter for screening for other disorder: Secondary | ICD-10-CM | POA: Diagnosis not present

## 2019-09-06 DIAGNOSIS — E785 Hyperlipidemia, unspecified: Secondary | ICD-10-CM | POA: Diagnosis not present

## 2019-09-06 DIAGNOSIS — M545 Low back pain: Secondary | ICD-10-CM | POA: Diagnosis not present

## 2019-09-06 DIAGNOSIS — K5900 Constipation, unspecified: Secondary | ICD-10-CM | POA: Diagnosis not present

## 2019-09-06 DIAGNOSIS — Z8781 Personal history of (healed) traumatic fracture: Secondary | ICD-10-CM | POA: Diagnosis not present

## 2019-09-23 ENCOUNTER — Other Ambulatory Visit: Payer: Self-pay

## 2019-09-23 ENCOUNTER — Ambulatory Visit: Payer: Medicare Other | Admitting: Orthopaedic Surgery

## 2019-09-23 ENCOUNTER — Ambulatory Visit: Payer: Self-pay

## 2019-09-23 DIAGNOSIS — Z96649 Presence of unspecified artificial hip joint: Secondary | ICD-10-CM

## 2019-09-23 DIAGNOSIS — Z96642 Presence of left artificial hip joint: Secondary | ICD-10-CM

## 2019-09-23 NOTE — Progress Notes (Signed)
Office Visit Note   Patient: Julia Blair           Date of Birth: [birth year]           MRN: [id number] Visit Date: 09/23/2019              Requested by: Caryl Bis, MD [place],  [place] [id number] PCP: Caryl Bis, MD   Assessment & Plan: Visit Diagnoses:  1. History of left hip hemiarthroplasty   2. Status post revision of total hip     Plan: At this point, follow-up for her left hip can be as needed since she is doing well.  If she develops any issues with that hip she will let us know.  Follow-Up Instructions: Return if symptoms worsen or fail to improve.   Orders:  Orders Placed This Encounter  Procedures  . XR HIP UNILAT W OR W/O PELVIS 2-3 VIEWS LEFT   No orders of the defined types were placed in this encounter.     Procedures: No procedures performed   Clinical Data: No additional findings.   Subjective: Chief Complaint  Patient presents with  . Left Hip - Follow-up    09/18/2018 conversion left hip hemi to total  The patient is now a year out from a left total hip arthroplasty.  She had had a left hip hemiarthroplasty done through a posterior approach several years prior to [place] replacing her hip.  At the time she was developing a significant groin pain.  In surgery through a direct anterior process we found the femoral component was loose as well.  We revised this to a total hip arthroplasty.  She says she is doing excellent and has no pain whatsoever at all and is very active and walks without assistive device.  She has had no acute change in her medical status.  HPI  Review of Systems She currently denies any headache, chest pain, shortness of breath, fever, chills, nausea, vomiting  Objective: Vital Signs: There were no vitals taken for this visit.  Physical Exam She is alert and oriented x3 and in no acute distress Ortho Exam Examination of her left hip shows it moves smoothly and fluidly and has no issues at all.  Her leg lengths  are equal. Specialty Comments:  No specialty comments available.  Imaging: XR HIP UNILAT W OR W/O PELVIS 2-3 VIEWS LEFT  Result Date: 09/23/2019 An AP pelvis and lateral left hip shows a well-seated total hip arthroplasty with no complicating features.    PMFS History: Patient Active Problem List   Diagnosis Date Noted  . Aseptic loosening of prosthetic hip, sequela 09/18/2018  . Status post revision of total hip 09/18/2018  . History of left hip hemiarthroplasty 08/01/2017  . Trochanteric bursitis, left hip 12/06/2016  . Pain in left hip 07/25/2016  . Unsteadiness on feet   . HLD (hyperlipidemia) 12/13/2015  . Fall 12/13/2015  . Leukocytopenia 12/13/2015  . Closed left hip fracture, initial encounter ([facility]) 12/13/2015   Past Medical History:  Diagnosis Date  . Arthritis   . Bladder incontinence   . High cholesterol     Family History  Problem Relation Age of Onset  . Asthma Mother   . Throat cancer Father   . Lung cancer Brother     Past Surgical History:  Procedure Laterality Date  . ABDOMINAL HYSTERECTOMY    . ANTERIOR HIP REVISION Left 09/18/2018   Procedure: Conversion of Left Hip Hemiarthroplasty to Total  Hip;  Surgeon: Mcarthur Rossetti, MD;  Location: [facility];  Service: Orthopedics;  Laterality: Left;  . CYST EXCISION Right    right arm  . HIP ARTHROPLASTY Left 12/14/2015   Procedure: ARTHROPLASTY BIPOLAR HIP (HEMIARTHROPLASTY);  Surgeon: Newt Minion, MD;  Location: [facility];  Service: Orthopedics;  Laterality: Left;   Social History   Occupational History  . Not on file  Tobacco Use  . Smoking status: Never Smoker  . Smokeless tobacco: Never Used  Vaping Use  . Vaping Use: Never used  Substance and Sexual Activity  . Alcohol use: Yes    Comment: Social drink  . Drug use: No  . Sexual activity: Not on file

## 2019-11-21 DIAGNOSIS — M79661 Pain in right lower leg: Secondary | ICD-10-CM | POA: Diagnosis not present

## 2019-11-21 DIAGNOSIS — M79604 Pain in right leg: Secondary | ICD-10-CM | POA: Diagnosis not present

## 2019-11-21 DIAGNOSIS — Z6824 Body mass index (BMI) 24.0-24.9, adult: Secondary | ICD-10-CM | POA: Diagnosis not present

## 2020-01-07 DIAGNOSIS — M545 Low back pain, unspecified: Secondary | ICD-10-CM | POA: Diagnosis not present

## 2020-01-07 DIAGNOSIS — E785 Hyperlipidemia, unspecified: Secondary | ICD-10-CM | POA: Diagnosis not present

## 2020-01-07 DIAGNOSIS — K5900 Constipation, unspecified: Secondary | ICD-10-CM | POA: Diagnosis not present

## 2020-01-07 DIAGNOSIS — Z8781 Personal history of (healed) traumatic fracture: Secondary | ICD-10-CM | POA: Diagnosis not present

## 2020-01-07 DIAGNOSIS — Z23 Encounter for immunization: Secondary | ICD-10-CM | POA: Diagnosis not present

## 2020-01-07 DIAGNOSIS — Z6824 Body mass index (BMI) 24.0-24.9, adult: Secondary | ICD-10-CM | POA: Diagnosis not present

## 2020-01-07 DIAGNOSIS — Z0001 Encounter for general adult medical examination with abnormal findings: Secondary | ICD-10-CM | POA: Diagnosis not present

## 2020-01-07 DIAGNOSIS — G4700 Insomnia, unspecified: Secondary | ICD-10-CM | POA: Diagnosis not present

## 2020-01-15 DIAGNOSIS — Z23 Encounter for immunization: Secondary | ICD-10-CM | POA: Diagnosis not present

## 2020-04-29 DIAGNOSIS — J019 Acute sinusitis, unspecified: Secondary | ICD-10-CM | POA: Diagnosis not present

## 2020-05-06 DIAGNOSIS — Z6824 Body mass index (BMI) 24.0-24.9, adult: Secondary | ICD-10-CM | POA: Diagnosis not present

## 2020-05-06 DIAGNOSIS — E785 Hyperlipidemia, unspecified: Secondary | ICD-10-CM | POA: Diagnosis not present

## 2020-05-06 DIAGNOSIS — K5900 Constipation, unspecified: Secondary | ICD-10-CM | POA: Diagnosis not present

## 2020-05-06 DIAGNOSIS — M545 Low back pain, unspecified: Secondary | ICD-10-CM | POA: Diagnosis not present

## 2020-05-06 DIAGNOSIS — G4700 Insomnia, unspecified: Secondary | ICD-10-CM | POA: Diagnosis not present

## 2020-05-06 DIAGNOSIS — S39012A Strain of muscle, fascia and tendon of lower back, initial encounter: Secondary | ICD-10-CM | POA: Diagnosis not present

## 2020-05-06 DIAGNOSIS — Z8781 Personal history of (healed) traumatic fracture: Secondary | ICD-10-CM | POA: Diagnosis not present

## 2020-05-08 DIAGNOSIS — H9203 Otalgia, bilateral: Secondary | ICD-10-CM | POA: Diagnosis not present

## 2020-06-24 DIAGNOSIS — L0291 Cutaneous abscess, unspecified: Secondary | ICD-10-CM | POA: Diagnosis not present

## 2020-07-07 ENCOUNTER — Telehealth: Payer: Self-pay

## 2020-07-07 NOTE — Telephone Encounter (Signed)
Dr. [facility] office would like pre-medication note/letter faxed to [phone].  Cb# [phone].  Please advise.  Thank you.

## 2020-07-07 NOTE — Telephone Encounter (Signed)
Faxed note.

## 2020-08-31 DIAGNOSIS — M545 Low back pain, unspecified: Secondary | ICD-10-CM | POA: Diagnosis not present

## 2020-09-01 DIAGNOSIS — Z23 Encounter for immunization: Secondary | ICD-10-CM | POA: Diagnosis not present

## 2020-09-03 DIAGNOSIS — M48061 Spinal stenosis, lumbar region without neurogenic claudication: Secondary | ICD-10-CM | POA: Diagnosis not present

## 2020-09-03 DIAGNOSIS — M545 Low back pain, unspecified: Secondary | ICD-10-CM | POA: Diagnosis not present

## 2020-10-05 DIAGNOSIS — L0390 Cellulitis, unspecified: Secondary | ICD-10-CM | POA: Diagnosis not present

## 2020-10-05 DIAGNOSIS — T148XXA Other injury of unspecified body region, initial encounter: Secondary | ICD-10-CM | POA: Diagnosis not present

## 2020-10-05 DIAGNOSIS — Z6823 Body mass index (BMI) 23.0-23.9, adult: Secondary | ICD-10-CM | POA: Diagnosis not present

## 2020-10-05 DIAGNOSIS — W57XXXA Bitten or stung by nonvenomous insect and other nonvenomous arthropods, initial encounter: Secondary | ICD-10-CM | POA: Diagnosis not present

## 2020-10-17 IMAGING — MR MR LUMBAR SPINE W/O CM
5 series · 48 of 48 positions shown · non-contrast
Comparison: None.

CLINICAL DATA: Spinal stenosis of lumbosacral region. Stabbing pain
extending into the left leg

EXAM:
MRI LUMBAR SPINE WITHOUT CONTRAST
TECHNIQUE: Multiplanar, multisequence MR imaging of the lumbar spine was
performed. No intravenous contrast was administered.

[Series 3: T2 · sagittal · 4.0mm · 0.88mm/px · 6 of 12 slices shown (1 of 2)]
[im 1/12]
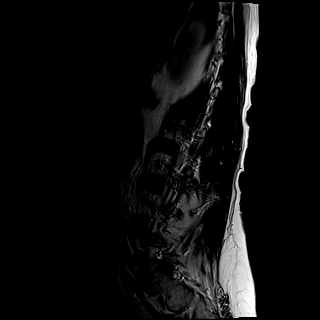
[im 3/12]
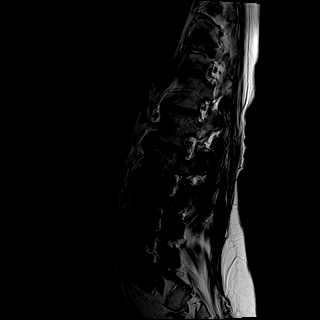
[im 5/12]
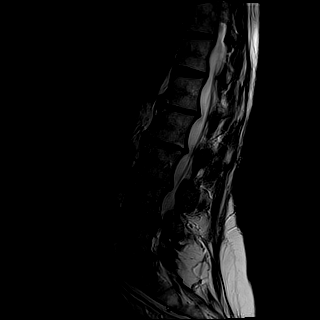
[im 7/12]
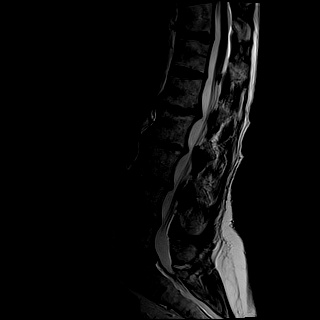
[im 9/12]
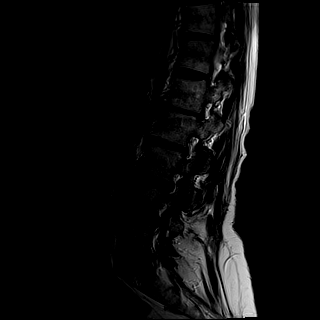
[im 12/12]
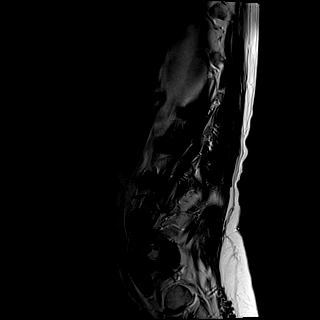

[Series 4: tirm sag · sagittal · 4.0mm · 0.88mm/px · 5 of 12 slices shown]
[im 1/12]
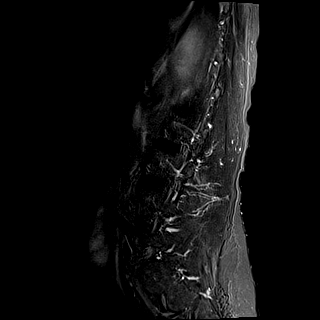
[im 3/12]
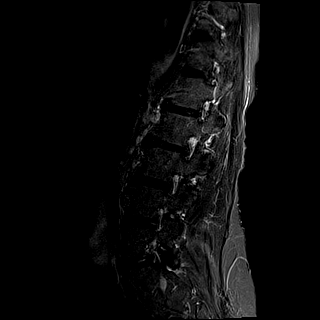
[im 6/12]
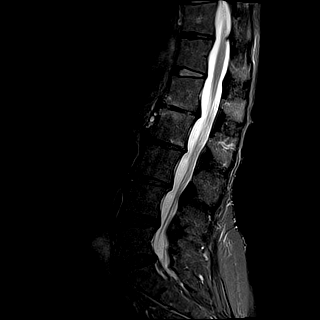
[im 9/12]
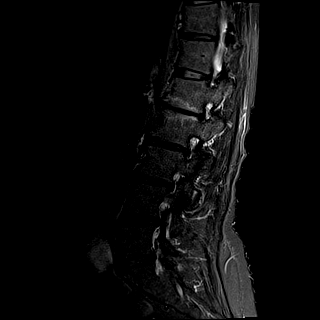
[im 12/12]
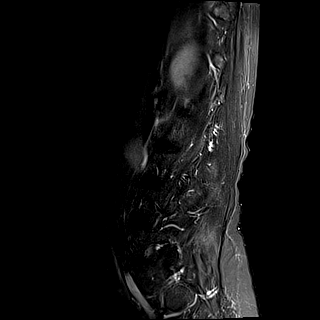

[Series 5: T1 · sagittal · 4.0mm · 0.88mm/px · 5 of 12 slices shown (1 of 2)]
[im 1/12]
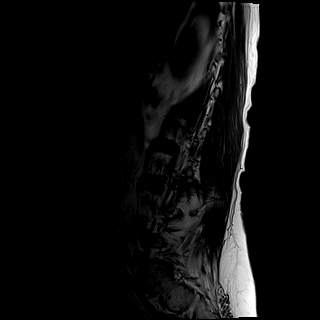
[im 3/12]
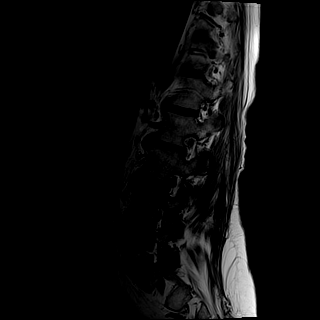
[im 6/12]
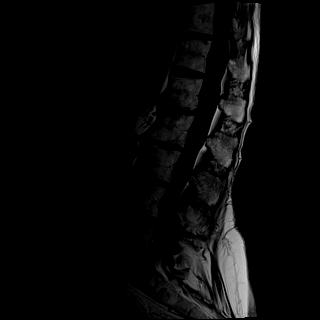
[im 9/12]
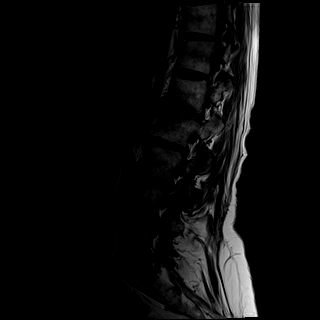
[im 12/12]
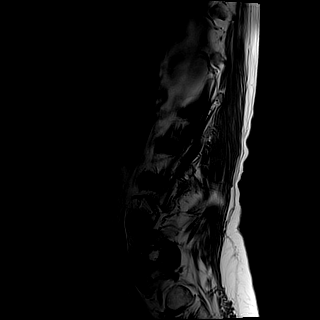

[Series 6: T2 · axial · 4.0mm · 0.70mm/px · z∈[-103,+101]mm · 16 of 38 slices shown (2 of 2)]
[im 1/38]
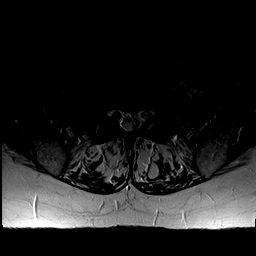
[im 3/38]
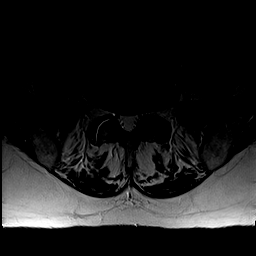
[im 5/38]
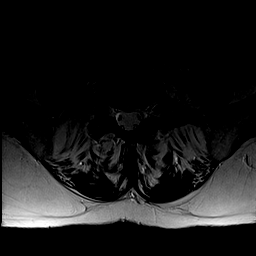
[im 8/38]
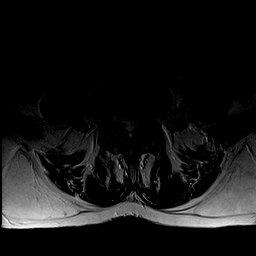
[im 10/38]
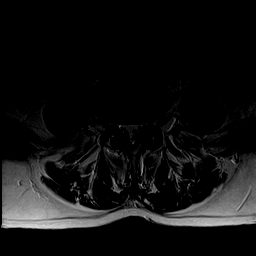
[im 13/38]
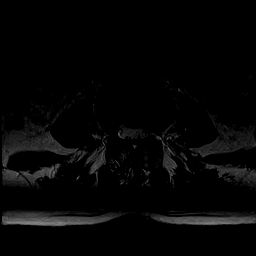
[im 15/38]
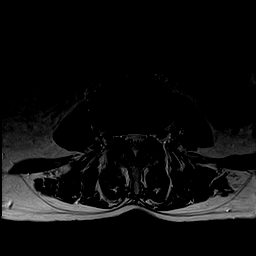
[im 18/38]
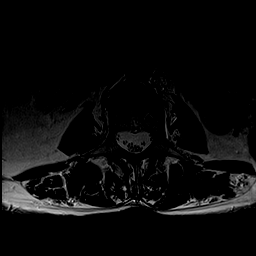
[im 20/38]
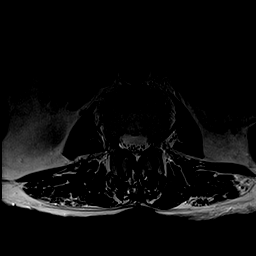
[im 23/38]
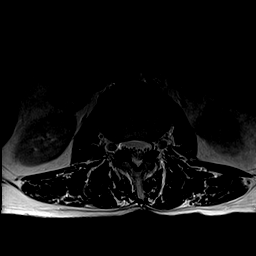
[im 25/38]
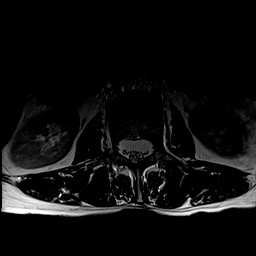
[im 28/38]
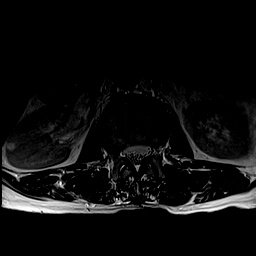
[im 30/38]
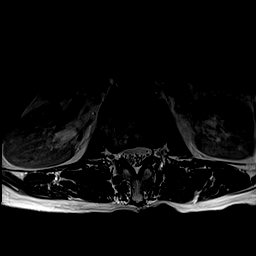
[im 33/38]
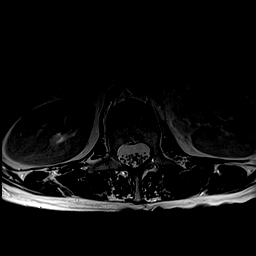
[im 35/38]
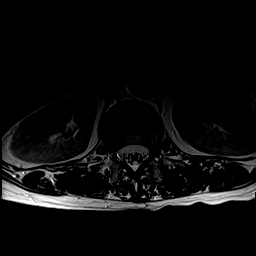
[im 38/38]
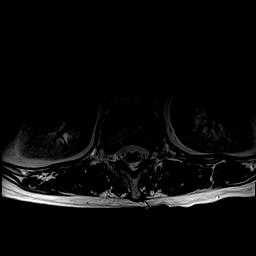

[Series 7: T1 · axial · 4.0mm · 0.70mm/px · z∈[-103,+101]mm · 16 of 38 slices shown (2 of 2)]
[im 1/38]
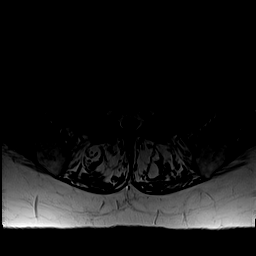
[im 3/38]
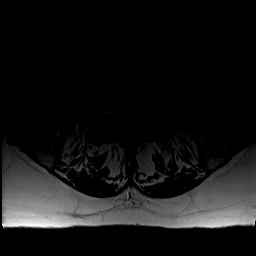
[im 5/38]
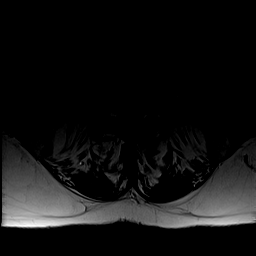
[im 8/38]
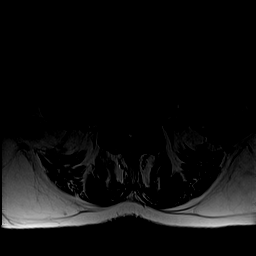
[im 10/38]
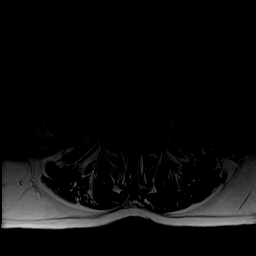
[im 13/38]
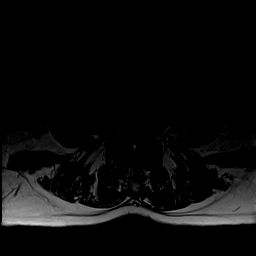
[im 15/38]
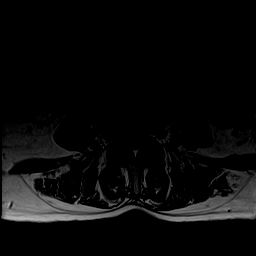
[im 18/38]
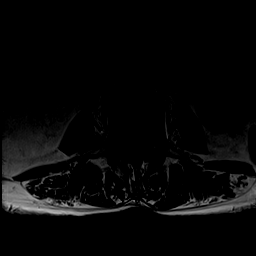
[im 20/38]
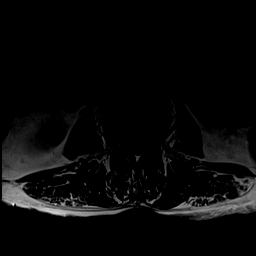
[im 23/38]
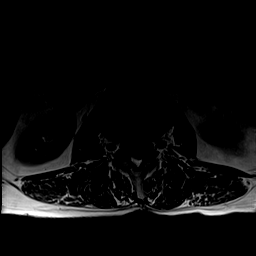
[im 25/38]
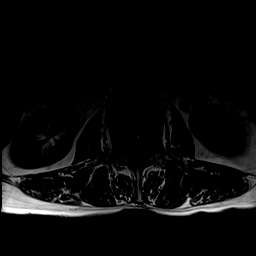
[im 28/38]
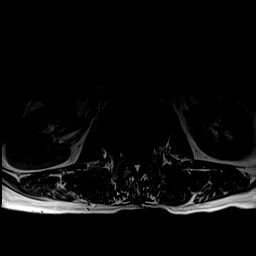
[im 30/38]
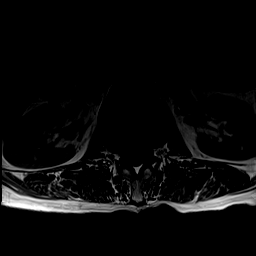
[im 33/38]
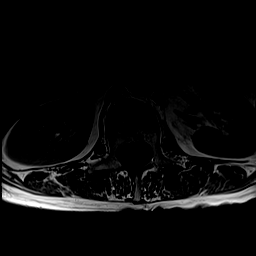
[im 35/38]
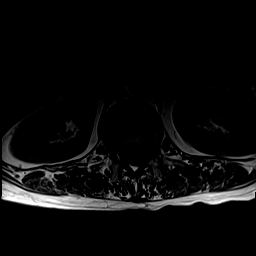
[im 38/38]
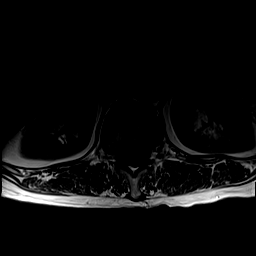

[48 of 48 positions shown; findings below may reference images not displayed]

FINDINGS: Segmentation:  5 lumbar type vertebral bodies

Alignment:  Negative for listhesis or scoliosis

Vertebrae:  No fracture, evidence of discitis, or bone lesion.  This

Conus medullaris and cauda equina: Conus extends to the T12-L1
level. Conus and cauda equina appear normal.

Paraspinal and other soft tissues: Negative

Disc levels:

T12- L1: Mild facet spurring.

L1-L2: Greatest level of degenerative disc narrowing with shallow
right foraminal protrusion. Negative facets. No impingement

L2-L3: Disc narrowing and bulge with mild posterior element
hypertrophy. Right subarticular recess narrowing without static L3
compression. The foramina are patent

L3-L4: Disc narrowing and circumferential bulging with posterior
element hypertrophy. Mild triangular narrowing of the thecal sac.

L4-L5: Disc narrowing and bulge. Mild posterior element hypertrophy.
Bilateral subarticular recess narrowing that could affect the L5
nerve roots. The foramina are patent

L5-S1:Mild disc narrowing and bulging. Mild facet spurring. No
impingement
IMPRESSION: 1. Disc and facet degeneration as described.
2. On the symptomatic left side there is L4-5 subarticular recess
narrowing that could irritate the L5 nerve root. Left foramina are
diffusely patent.
3. On the right there is subarticular recess narrowing at L2-3 and
L4-5.

## 2020-11-06 DIAGNOSIS — Z8781 Personal history of (healed) traumatic fracture: Secondary | ICD-10-CM | POA: Diagnosis not present

## 2020-11-06 DIAGNOSIS — Z1389 Encounter for screening for other disorder: Secondary | ICD-10-CM | POA: Diagnosis not present

## 2020-11-06 DIAGNOSIS — G4700 Insomnia, unspecified: Secondary | ICD-10-CM | POA: Diagnosis not present

## 2020-11-06 DIAGNOSIS — K5900 Constipation, unspecified: Secondary | ICD-10-CM | POA: Diagnosis not present

## 2020-11-06 DIAGNOSIS — M545 Low back pain, unspecified: Secondary | ICD-10-CM | POA: Diagnosis not present

## 2020-11-06 DIAGNOSIS — E785 Hyperlipidemia, unspecified: Secondary | ICD-10-CM | POA: Diagnosis not present

## 2021-01-08 DIAGNOSIS — E785 Hyperlipidemia, unspecified: Secondary | ICD-10-CM | POA: Diagnosis not present

## 2021-01-08 DIAGNOSIS — Z0001 Encounter for general adult medical examination with abnormal findings: Secondary | ICD-10-CM | POA: Diagnosis not present

## 2021-01-08 DIAGNOSIS — I10 Essential (primary) hypertension: Secondary | ICD-10-CM | POA: Diagnosis not present

## 2021-03-17 DIAGNOSIS — E785 Hyperlipidemia, unspecified: Secondary | ICD-10-CM | POA: Diagnosis not present

## 2021-03-17 DIAGNOSIS — G4700 Insomnia, unspecified: Secondary | ICD-10-CM | POA: Diagnosis not present

## 2021-03-17 DIAGNOSIS — K5900 Constipation, unspecified: Secondary | ICD-10-CM | POA: Diagnosis not present

## 2021-03-17 DIAGNOSIS — Z23 Encounter for immunization: Secondary | ICD-10-CM | POA: Diagnosis not present

## 2021-03-17 DIAGNOSIS — Z6824 Body mass index (BMI) 24.0-24.9, adult: Secondary | ICD-10-CM | POA: Diagnosis not present

## 2021-03-17 DIAGNOSIS — M545 Low back pain, unspecified: Secondary | ICD-10-CM | POA: Diagnosis not present

## 2021-03-17 DIAGNOSIS — Z8781 Personal history of (healed) traumatic fracture: Secondary | ICD-10-CM | POA: Diagnosis not present

## 2021-05-28 DIAGNOSIS — Z1231 Encounter for screening mammogram for malignant neoplasm of breast: Secondary | ICD-10-CM | POA: Diagnosis not present

## 2021-07-15 DIAGNOSIS — J019 Acute sinusitis, unspecified: Secondary | ICD-10-CM | POA: Diagnosis not present

## 2021-07-15 DIAGNOSIS — M545 Low back pain, unspecified: Secondary | ICD-10-CM | POA: Diagnosis not present

## 2021-07-15 DIAGNOSIS — Z8781 Personal history of (healed) traumatic fracture: Secondary | ICD-10-CM | POA: Diagnosis not present

## 2021-07-15 DIAGNOSIS — K5900 Constipation, unspecified: Secondary | ICD-10-CM | POA: Diagnosis not present

## 2021-07-15 DIAGNOSIS — G4700 Insomnia, unspecified: Secondary | ICD-10-CM | POA: Diagnosis not present

## 2021-07-15 DIAGNOSIS — Z6823 Body mass index (BMI) 23.0-23.9, adult: Secondary | ICD-10-CM | POA: Diagnosis not present

## 2021-07-15 DIAGNOSIS — E785 Hyperlipidemia, unspecified: Secondary | ICD-10-CM | POA: Diagnosis not present

## 2021-12-20 DIAGNOSIS — E785 Hyperlipidemia, unspecified: Secondary | ICD-10-CM | POA: Diagnosis not present

## 2021-12-20 DIAGNOSIS — Z1329 Encounter for screening for other suspected endocrine disorder: Secondary | ICD-10-CM | POA: Diagnosis not present

## 2021-12-20 DIAGNOSIS — I10 Essential (primary) hypertension: Secondary | ICD-10-CM | POA: Diagnosis not present

## 2021-12-21 DIAGNOSIS — W57XXXA Bitten or stung by nonvenomous insect and other nonvenomous arthropods, initial encounter: Secondary | ICD-10-CM | POA: Diagnosis not present

## 2021-12-21 DIAGNOSIS — R5383 Other fatigue: Secondary | ICD-10-CM | POA: Diagnosis not present

## 2021-12-23 DIAGNOSIS — K5900 Constipation, unspecified: Secondary | ICD-10-CM | POA: Diagnosis not present

## 2021-12-23 DIAGNOSIS — Z8781 Personal history of (healed) traumatic fracture: Secondary | ICD-10-CM | POA: Diagnosis not present

## 2021-12-23 DIAGNOSIS — R030 Elevated blood-pressure reading, without diagnosis of hypertension: Secondary | ICD-10-CM | POA: Diagnosis not present

## 2021-12-23 DIAGNOSIS — S39012A Strain of muscle, fascia and tendon of lower back, initial encounter: Secondary | ICD-10-CM | POA: Diagnosis not present

## 2021-12-23 DIAGNOSIS — J019 Acute sinusitis, unspecified: Secondary | ICD-10-CM | POA: Diagnosis not present

## 2021-12-23 DIAGNOSIS — R7989 Other specified abnormal findings of blood chemistry: Secondary | ICD-10-CM | POA: Diagnosis not present

## 2021-12-23 DIAGNOSIS — G4700 Insomnia, unspecified: Secondary | ICD-10-CM | POA: Diagnosis not present

## 2021-12-23 DIAGNOSIS — E785 Hyperlipidemia, unspecified: Secondary | ICD-10-CM | POA: Diagnosis not present

## 2021-12-23 DIAGNOSIS — J209 Acute bronchitis, unspecified: Secondary | ICD-10-CM | POA: Diagnosis not present

## 2022-02-07 DIAGNOSIS — J019 Acute sinusitis, unspecified: Secondary | ICD-10-CM | POA: Diagnosis not present

## 2022-02-07 DIAGNOSIS — R059 Cough, unspecified: Secondary | ICD-10-CM | POA: Diagnosis not present

## 2022-02-15 DIAGNOSIS — J019 Acute sinusitis, unspecified: Secondary | ICD-10-CM | POA: Diagnosis not present

## 2022-03-01 DIAGNOSIS — Z23 Encounter for immunization: Secondary | ICD-10-CM | POA: Diagnosis not present

## 2022-03-01 DIAGNOSIS — S61419A Laceration without foreign body of unspecified hand, initial encounter: Secondary | ICD-10-CM | POA: Diagnosis not present

## 2022-03-01 DIAGNOSIS — R030 Elevated blood-pressure reading, without diagnosis of hypertension: Secondary | ICD-10-CM | POA: Diagnosis not present

## 2022-03-01 DIAGNOSIS — H00019 Hordeolum externum unspecified eye, unspecified eyelid: Secondary | ICD-10-CM | POA: Diagnosis not present

## 2022-03-08 DIAGNOSIS — R059 Cough, unspecified: Secondary | ICD-10-CM | POA: Diagnosis not present

## 2022-04-11 DIAGNOSIS — R918 Other nonspecific abnormal finding of lung field: Secondary | ICD-10-CM | POA: Diagnosis not present

## 2022-04-22 DIAGNOSIS — E039 Hypothyroidism, unspecified: Secondary | ICD-10-CM | POA: Diagnosis not present

## 2022-04-22 DIAGNOSIS — I10 Essential (primary) hypertension: Secondary | ICD-10-CM | POA: Diagnosis not present

## 2022-04-27 ENCOUNTER — Other Ambulatory Visit (HOSPITAL_COMMUNITY): Payer: Self-pay | Admitting: Physician Assistant

## 2022-04-27 DIAGNOSIS — G4700 Insomnia, unspecified: Secondary | ICD-10-CM | POA: Diagnosis not present

## 2022-04-27 DIAGNOSIS — M810 Age-related osteoporosis without current pathological fracture: Secondary | ICD-10-CM

## 2022-04-27 DIAGNOSIS — Z0001 Encounter for general adult medical examination with abnormal findings: Secondary | ICD-10-CM | POA: Diagnosis not present

## 2022-04-27 DIAGNOSIS — M542 Cervicalgia: Secondary | ICD-10-CM | POA: Diagnosis not present

## 2022-04-27 DIAGNOSIS — E785 Hyperlipidemia, unspecified: Secondary | ICD-10-CM | POA: Diagnosis not present

## 2022-04-27 DIAGNOSIS — R7989 Other specified abnormal findings of blood chemistry: Secondary | ICD-10-CM | POA: Diagnosis not present

## 2022-04-27 DIAGNOSIS — Z6824 Body mass index (BMI) 24.0-24.9, adult: Secondary | ICD-10-CM | POA: Diagnosis not present

## 2022-04-27 DIAGNOSIS — Z23 Encounter for immunization: Secondary | ICD-10-CM | POA: Diagnosis not present

## 2022-04-27 DIAGNOSIS — Z8781 Personal history of (healed) traumatic fracture: Secondary | ICD-10-CM | POA: Diagnosis not present

## 2022-04-27 DIAGNOSIS — R030 Elevated blood-pressure reading, without diagnosis of hypertension: Secondary | ICD-10-CM | POA: Diagnosis not present

## 2022-04-27 DIAGNOSIS — K5900 Constipation, unspecified: Secondary | ICD-10-CM | POA: Diagnosis not present

## 2022-05-17 DIAGNOSIS — Z6823 Body mass index (BMI) 23.0-23.9, adult: Secondary | ICD-10-CM | POA: Diagnosis not present

## 2022-05-17 DIAGNOSIS — J019 Acute sinusitis, unspecified: Secondary | ICD-10-CM | POA: Diagnosis not present

## 2022-05-25 ENCOUNTER — Ambulatory Visit (HOSPITAL_COMMUNITY)
Admission: RE | Admit: 2022-05-25 | Discharge: 2022-05-25 | Disposition: A | Discharge status: Home / Self Care | Payer: Medicare Other | Source: Ambulatory Visit | Attending: Physician Assistant | Admitting: Physician Assistant

## 2022-05-25 DIAGNOSIS — M810 Age-related osteoporosis without current pathological fracture: Secondary | ICD-10-CM

## 2022-05-31 DIAGNOSIS — Z1231 Encounter for screening mammogram for malignant neoplasm of breast: Secondary | ICD-10-CM | POA: Diagnosis not present

## 2022-07-25 DIAGNOSIS — G4700 Insomnia, unspecified: Secondary | ICD-10-CM | POA: Diagnosis not present

## 2022-07-25 DIAGNOSIS — R030 Elevated blood-pressure reading, without diagnosis of hypertension: Secondary | ICD-10-CM | POA: Diagnosis not present

## 2022-07-25 DIAGNOSIS — W57XXXA Bitten or stung by nonvenomous insect and other nonvenomous arthropods, initial encounter: Secondary | ICD-10-CM | POA: Diagnosis not present

## 2022-07-25 DIAGNOSIS — M545 Low back pain, unspecified: Secondary | ICD-10-CM | POA: Diagnosis not present

## 2022-07-25 DIAGNOSIS — R7989 Other specified abnormal findings of blood chemistry: Secondary | ICD-10-CM | POA: Diagnosis not present

## 2022-07-25 DIAGNOSIS — Z8781 Personal history of (healed) traumatic fracture: Secondary | ICD-10-CM | POA: Diagnosis not present

## 2022-07-25 DIAGNOSIS — E785 Hyperlipidemia, unspecified: Secondary | ICD-10-CM | POA: Diagnosis not present

## 2022-07-25 DIAGNOSIS — K5900 Constipation, unspecified: Secondary | ICD-10-CM | POA: Diagnosis not present

## 2022-07-25 DIAGNOSIS — M542 Cervicalgia: Secondary | ICD-10-CM | POA: Diagnosis not present

## 2022-07-25 DIAGNOSIS — Z6824 Body mass index (BMI) 24.0-24.9, adult: Secondary | ICD-10-CM | POA: Diagnosis not present

## 2022-08-19 DIAGNOSIS — I10 Essential (primary) hypertension: Secondary | ICD-10-CM | POA: Diagnosis not present

## 2022-08-19 DIAGNOSIS — E785 Hyperlipidemia, unspecified: Secondary | ICD-10-CM | POA: Diagnosis not present

## 2022-08-19 DIAGNOSIS — R7989 Other specified abnormal findings of blood chemistry: Secondary | ICD-10-CM | POA: Diagnosis not present

## 2022-08-26 DIAGNOSIS — M542 Cervicalgia: Secondary | ICD-10-CM | POA: Diagnosis not present

## 2022-08-26 DIAGNOSIS — E7800 Pure hypercholesterolemia, unspecified: Secondary | ICD-10-CM | POA: Diagnosis not present

## 2022-08-26 DIAGNOSIS — Z23 Encounter for immunization: Secondary | ICD-10-CM | POA: Diagnosis not present

## 2022-08-26 DIAGNOSIS — R7989 Other specified abnormal findings of blood chemistry: Secondary | ICD-10-CM | POA: Diagnosis not present

## 2022-08-26 DIAGNOSIS — E785 Hyperlipidemia, unspecified: Secondary | ICD-10-CM | POA: Diagnosis not present

## 2022-08-26 DIAGNOSIS — I10 Essential (primary) hypertension: Secondary | ICD-10-CM | POA: Diagnosis not present

## 2022-08-26 DIAGNOSIS — G4700 Insomnia, unspecified: Secondary | ICD-10-CM | POA: Diagnosis not present

## 2022-08-26 DIAGNOSIS — K5900 Constipation, unspecified: Secondary | ICD-10-CM | POA: Diagnosis not present

## 2022-08-26 DIAGNOSIS — Z8781 Personal history of (healed) traumatic fracture: Secondary | ICD-10-CM | POA: Diagnosis not present

## 2022-08-26 DIAGNOSIS — M25511 Pain in right shoulder: Secondary | ICD-10-CM | POA: Diagnosis not present

## 2022-08-26 DIAGNOSIS — M545 Low back pain, unspecified: Secondary | ICD-10-CM | POA: Diagnosis not present

## 2022-10-11 DIAGNOSIS — Z23 Encounter for immunization: Secondary | ICD-10-CM | POA: Diagnosis not present

## 2022-10-21 DIAGNOSIS — R030 Elevated blood-pressure reading, without diagnosis of hypertension: Secondary | ICD-10-CM | POA: Diagnosis not present

## 2022-10-21 DIAGNOSIS — R1013 Epigastric pain: Secondary | ICD-10-CM | POA: Diagnosis not present

## 2022-10-21 DIAGNOSIS — K219 Gastro-esophageal reflux disease without esophagitis: Secondary | ICD-10-CM | POA: Diagnosis not present

## 2022-10-21 DIAGNOSIS — R140 Abdominal distension (gaseous): Secondary | ICD-10-CM | POA: Diagnosis not present

## 2022-10-21 DIAGNOSIS — Z6824 Body mass index (BMI) 24.0-24.9, adult: Secondary | ICD-10-CM | POA: Diagnosis not present

## 2022-11-04 DIAGNOSIS — R1084 Generalized abdominal pain: Secondary | ICD-10-CM | POA: Diagnosis not present

## 2022-11-04 DIAGNOSIS — R141 Gas pain: Secondary | ICD-10-CM | POA: Diagnosis not present

## 2022-11-04 DIAGNOSIS — R197 Diarrhea, unspecified: Secondary | ICD-10-CM | POA: Diagnosis not present

## 2022-12-14 DIAGNOSIS — S39012A Strain of muscle, fascia and tendon of lower back, initial encounter: Secondary | ICD-10-CM | POA: Diagnosis not present

## 2022-12-14 DIAGNOSIS — Z6824 Body mass index (BMI) 24.0-24.9, adult: Secondary | ICD-10-CM | POA: Diagnosis not present

## 2022-12-14 DIAGNOSIS — R300 Dysuria: Secondary | ICD-10-CM | POA: Diagnosis not present

## 2023-01-02 DIAGNOSIS — R1011 Right upper quadrant pain: Secondary | ICD-10-CM | POA: Diagnosis not present

## 2023-01-02 DIAGNOSIS — M542 Cervicalgia: Secondary | ICD-10-CM | POA: Diagnosis not present

## 2023-01-02 DIAGNOSIS — Z8781 Personal history of (healed) traumatic fracture: Secondary | ICD-10-CM | POA: Diagnosis not present

## 2023-01-02 DIAGNOSIS — R7989 Other specified abnormal findings of blood chemistry: Secondary | ICD-10-CM | POA: Diagnosis not present

## 2023-01-02 DIAGNOSIS — K5900 Constipation, unspecified: Secondary | ICD-10-CM | POA: Diagnosis not present

## 2023-01-02 DIAGNOSIS — I10 Essential (primary) hypertension: Secondary | ICD-10-CM | POA: Diagnosis not present

## 2023-01-02 DIAGNOSIS — Z23 Encounter for immunization: Secondary | ICD-10-CM | POA: Diagnosis not present

## 2023-01-02 DIAGNOSIS — M25511 Pain in right shoulder: Secondary | ICD-10-CM | POA: Diagnosis not present

## 2023-01-02 DIAGNOSIS — G4700 Insomnia, unspecified: Secondary | ICD-10-CM | POA: Diagnosis not present

## 2023-01-02 DIAGNOSIS — M545 Low back pain, unspecified: Secondary | ICD-10-CM | POA: Diagnosis not present

## 2023-01-02 DIAGNOSIS — E785 Hyperlipidemia, unspecified: Secondary | ICD-10-CM | POA: Diagnosis not present

## 2023-01-05 DIAGNOSIS — Z6824 Body mass index (BMI) 24.0-24.9, adult: Secondary | ICD-10-CM | POA: Diagnosis not present

## 2023-01-05 DIAGNOSIS — R1011 Right upper quadrant pain: Secondary | ICD-10-CM | POA: Diagnosis not present

## 2023-01-05 DIAGNOSIS — L0390 Cellulitis, unspecified: Secondary | ICD-10-CM | POA: Diagnosis not present

## 2023-05-05 DIAGNOSIS — E785 Hyperlipidemia, unspecified: Secondary | ICD-10-CM | POA: Diagnosis not present

## 2023-05-05 DIAGNOSIS — K5900 Constipation, unspecified: Secondary | ICD-10-CM | POA: Diagnosis not present

## 2023-05-05 DIAGNOSIS — Z0001 Encounter for general adult medical examination with abnormal findings: Secondary | ICD-10-CM | POA: Diagnosis not present

## 2023-05-05 DIAGNOSIS — Z8781 Personal history of (healed) traumatic fracture: Secondary | ICD-10-CM | POA: Diagnosis not present

## 2023-05-05 DIAGNOSIS — R5383 Other fatigue: Secondary | ICD-10-CM | POA: Diagnosis not present

## 2023-05-05 DIAGNOSIS — Z0000 Encounter for general adult medical examination without abnormal findings: Secondary | ICD-10-CM | POA: Diagnosis not present

## 2023-05-05 DIAGNOSIS — Z6824 Body mass index (BMI) 24.0-24.9, adult: Secondary | ICD-10-CM | POA: Diagnosis not present

## 2023-05-05 DIAGNOSIS — M545 Low back pain, unspecified: Secondary | ICD-10-CM | POA: Diagnosis not present

## 2023-05-05 DIAGNOSIS — R7989 Other specified abnormal findings of blood chemistry: Secondary | ICD-10-CM | POA: Diagnosis not present

## 2023-05-10 DIAGNOSIS — R1013 Epigastric pain: Secondary | ICD-10-CM | POA: Diagnosis not present

## 2023-05-30 ENCOUNTER — Encounter: Payer: Self-pay | Admitting: *Deleted

## 2023-06-01 DIAGNOSIS — Z1231 Encounter for screening mammogram for malignant neoplasm of breast: Secondary | ICD-10-CM | POA: Diagnosis not present

## 2023-06-03 NOTE — Progress Notes (Unsigned)
 Referring Provider: Sheela Stack  Primary Care Physician:  Richardean Chimera, MD Primary Gastroenterologist:  Dr. Marletta Lor  Chief Complaint  Patient presents with   Abdominal Pain    Had H. Pylori a year ago. Thinks she still has it. Stomach hurts all the times and his acid reflux     HPI:   Julia Blair is a 78 y.o. female presenting today at the request of Sheela Stack for epigastric pain.   Reviewed referral information. OV 05/05/23 with reports of 2-3 month history of abdominal pain, worsened postprandially. Also with bloating and constipation. Epigastric tenderness noted on exam. Labs completed same day with CBC, CMP wnl. TSH elevated at 5.65. She completed abdominal US on 05/10/23 which was unremarkable.   Today:  For about 1 year, she has had bloating, belching, feels like something isn't right in her upper abdomen. Bloating is moreso after meals. Bowels moving daily with MiraLAX every other day. No brbpr or melena.   Buttermilk was bad, but bloating is pretty much with anything.  Regular milk doesn't seem to cause a problem.  No issues with bread or pasta.    History of H pylori. H pylori stool antigen positive in August 2024. Was treated at [facility]. States she just took "2 pills". Maybe 10 days. Doesn't think treatment helped at all.   Had nausea initially, but none in months.   Some heartburn when laying down at night a few times a week. Will take tums a couple times a week as needed. Last took yesterday.   No dysphagia.  No weight loss.   Excedrin almost every morning as she wakes with a headache. No other NSAIDs aside from 81 mg aspirin.  Started fosamax around the same time as bloating. Takes with a full glass of water in the morning.   No prior EGD.  Last colonoscopy at [facility]. Had polyps removed. Can't remember her last. Not more than 10 years ago.   Past Medical History:  Diagnosis Date   Arthritis    Bladder incontinence     High cholesterol    Osteoporosis     Past Surgical History:  Procedure Laterality Date   ABDOMINAL HYSTERECTOMY     ANTERIOR HIP REVISION Left 09/18/2018   Procedure: Conversion of Left Hip Hemiarthroplasty to Total Hip;  Surgeon: Kathryne Hitch, MD;  Location: [facility] OR;  Service: Orthopedics;  Laterality: Left;   CYST EXCISION Right    right arm   HIP ARTHROPLASTY Left 12/14/2015   Procedure: ARTHROPLASTY BIPOLAR HIP (HEMIARTHROPLASTY);  Surgeon: Nadara Mustard, MD;  Location: [facility] OR;  Service: Orthopedics;  Laterality: Left;    Current Outpatient Medications  Medication Sig Dispense Refill   alendronate (FOSAMAX) 70 MG tablet Take 70 mg by mouth once a week.     aspirin 81 MG chewable tablet Chew 1 tablet (81 mg total) by mouth 2 (two) times daily. 30 tablet 0   aspirin-acetaminophen-caffeine (EXCEDRIN MIGRAINE) 250-250-65 MG tablet Take 2 tablets by mouth every 6 (six) hours as needed for headache or migraine.     Calcium Carbonate-Vitamin D (CALCIUM 600/VITAMIN D PO) Take 1 tablet by mouth daily.     Coenzyme Q10 (CO Q 10) 100 MG CAPS Take 100 mg by mouth daily.     glucosamine-chondroitin 500-400 MG tablet Take 1 tablet by mouth daily.      HYDROcodone-acetaminophen (NORCO/VICODIN) 5-325 MG tablet Take 1 tablet by mouth every 6 (six) hours as needed for moderate  pain. 40 tablet 0   Multiple Vitamin (MULTIVITAMIN) tablet Take 1 tablet by mouth daily.     polyethylene glycol (MIRALAX / GLYCOLAX) 17 g packet Take 17 g by mouth once a week.     rosuvastatin (CRESTOR) 5 MG tablet SMARTSIG:1 Tablet(s) By Mouth Every Evening     traZODone (DESYREL) 50 MG tablet Take 100 mg by mouth at bedtime.     No current facility-administered medications for this visit.    Allergies as of 06/05/2023 - Review Complete 06/05/2023  Allergen Reaction Noted   Naproxen  12/13/2015    Family History  Problem Relation Age of Onset   Asthma Mother    Throat cancer Father    Lung cancer Brother     Stomach cancer Brother    Colon cancer Neg Hx     Social History   Socioeconomic History   Marital status: Married    Spouse name: Not on file   Number of children: Not on file   Years of education: Not on file   Highest education level: Not on file  Occupational History   Not on file  Tobacco Use   Smoking status: Never   Smokeless tobacco: Never  Vaping Use   Vaping status: Never Used  Substance and Sexual Activity   Alcohol use: Not Currently    Comment: Social drink   Drug use: No   Sexual activity: Not on file  Other Topics Concern   Not on file  Social History Narrative   Not on file   Social Drivers of Health   Financial Resource Strain: Not on file  Food Insecurity: Not on file  Transportation Needs: Not on file  Physical Activity: Not on file  Stress: Not on file  Social Connections: Not on file  Intimate Partner Violence: Not on file    Review of Systems: Gen: Denies any fever, chills, cold or flulike symptoms, presyncope, syncope. CV: Denies chest pain, heart palpitations. Resp: Denies shortness of breath, cough. GI: D see HPI GU : Denies urinary burning, urinary frequency, urinary hesitancy MS: Denies joint pain. Derm: Denies rash. Psych: Denies depression, anxiety.. Heme: See HPI  Physical Exam: BP 122/83 (BP Location: Right Arm, Patient Position: Sitting, Cuff Size: Normal)   Pulse 69   Temp 97.8 F (36.6 C) (Temporal)   Ht 5\' 2"  (1.575 m)   Wt 141 lb (64 kg)   BMI 25.79 kg/m  General:   Alert and oriented. Pleasant and cooperative. Well-nourished and well-developed.  Head:  Normocephalic and atraumatic. Eyes:  Without icterus, sclera clear and conjunctiva pink.  Ears:  Normal auditory acuity. Lungs:  Clear to auscultation bilaterally. No wheezes, rales, or rhonchi. No distress.  Heart:  S1, S2 present without murmurs appreciated.  Abdomen:  +BS, soft, and non-distended. Mild "discomfort" to palpation of upper abdomen. No HSM noted. No  guarding or rebound. No masses appreciated.  Rectal:  Deferred  Msk:  Symmetrical without gross deformities. Normal posture. Extremities:  Without edema. Neurologic:  Alert and  oriented x4;  grossly normal neurologically. Skin:  Intact without significant lesions or rashes. Psych:   Normal mood and affect.    Assessment:  78 year old female with history of arthritis, osteoporosis, bladder incontinence, HLD, H. pylori, presenting today for further evaluation of upper abdominal discomfort, bloating, belching without specific food triggers. Denies constipation with MiraLAX every other day.  No unintentional weight loss.  Regarding her history of H. pylori, she tested positive via stool antigen in August 2024 and  was treated per PCP.  Unclear what regimen she took, but states she took 2 pills for 10 days.  She did have nausea, but this resolved, but no other improvement in her GI symptoms. Admits to taking Excedrin Migraine daily.   It is possible that upper GI symptoms are secondary to persistent H. pylori.  May also have gastritis, duodenitis, PUD, esophagitis, GERD.  Less likely malignancy though she does report brother with stomach cancer.    Plan:  Proceed with upper endoscopy with propofol by Dr. Marletta Lor in near future. The risks, benefits, and alternatives have been discussed with the patient in detail. The patient states understanding and desires to proceed.  ASA 2 Offered starting daily PPI, but patient declined pending EGD findings. Avoid NSAIDs is much as possible. Request records from [facility].   Ermalinda Memos, [facility] 06/05/2023

## 2023-06-03 NOTE — H&P (View-Only) (Signed)
 Referring Provider: Sheela Stack  Primary Care Physician:  Richardean Chimera, MD Primary Gastroenterologist:  Dr. Marletta Lor  Chief Complaint  Patient presents with   Abdominal Pain    Had H. Pylori a year ago. Thinks she still has it. Stomach hurts all the times and his acid reflux     HPI:   Julia Blair is a 78 y.o. female presenting today at the request of Sheela Stack for epigastric pain.   Reviewed referral information. OV 05/05/23 with reports of 2-3 month history of abdominal pain, worsened postprandially. Also with bloating and constipation. Epigastric tenderness noted on exam. Labs completed same day with CBC, CMP wnl. TSH elevated at 5.65. She completed abdominal US on 05/10/23 which was unremarkable.   Today:  For about 1 year, she has had bloating, belching, feels like something isn't right in her upper abdomen. Bloating is moreso after meals. Bowels moving daily with MiraLAX every other day. No brbpr or melena.   Buttermilk was bad, but bloating is pretty much with anything.  Regular milk doesn't seem to cause a problem.  No issues with bread or pasta.    History of H pylori. H pylori stool antigen positive in August 2024. Was treated at [facility]. States she just took "2 pills". Maybe 10 days. Doesn't think treatment helped at all.   Had nausea initially, but none in months.   Some heartburn when laying down at night a few times a week. Will take tums a couple times a week as needed. Last took yesterday.   No dysphagia.  No weight loss.   Excedrin almost every morning as she wakes with a headache. No other NSAIDs aside from 81 mg aspirin.  Started fosamax around the same time as bloating. Takes with a full glass of water in the morning.   No prior EGD.  Last colonoscopy at [facility]. Had polyps removed. Can't remember her last. Not more than 10 years ago.   Past Medical History:  Diagnosis Date   Arthritis    Bladder incontinence     High cholesterol    Osteoporosis     Past Surgical History:  Procedure Laterality Date   ABDOMINAL HYSTERECTOMY     ANTERIOR HIP REVISION Left 09/18/2018   Procedure: Conversion of Left Hip Hemiarthroplasty to Total Hip;  Surgeon: Kathryne Hitch, MD;  Location: [facility] OR;  Service: Orthopedics;  Laterality: Left;   CYST EXCISION Right    right arm   HIP ARTHROPLASTY Left 12/14/2015   Procedure: ARTHROPLASTY BIPOLAR HIP (HEMIARTHROPLASTY);  Surgeon: Nadara Mustard, MD;  Location: [facility] OR;  Service: Orthopedics;  Laterality: Left;    Current Outpatient Medications  Medication Sig Dispense Refill   alendronate (FOSAMAX) 70 MG tablet Take 70 mg by mouth once a week.     aspirin 81 MG chewable tablet Chew 1 tablet (81 mg total) by mouth 2 (two) times daily. 30 tablet 0   aspirin-acetaminophen-caffeine (EXCEDRIN MIGRAINE) 250-250-65 MG tablet Take 2 tablets by mouth every 6 (six) hours as needed for headache or migraine.     Calcium Carbonate-Vitamin D (CALCIUM 600/VITAMIN D PO) Take 1 tablet by mouth daily.     Coenzyme Q10 (CO Q 10) 100 MG CAPS Take 100 mg by mouth daily.     glucosamine-chondroitin 500-400 MG tablet Take 1 tablet by mouth daily.      HYDROcodone-acetaminophen (NORCO/VICODIN) 5-325 MG tablet Take 1 tablet by mouth every 6 (six) hours as needed for moderate  pain. 40 tablet 0   Multiple Vitamin (MULTIVITAMIN) tablet Take 1 tablet by mouth daily.     polyethylene glycol (MIRALAX / GLYCOLAX) 17 g packet Take 17 g by mouth once a week.     rosuvastatin (CRESTOR) 5 MG tablet SMARTSIG:1 Tablet(s) By Mouth Every Evening     traZODone (DESYREL) 50 MG tablet Take 100 mg by mouth at bedtime.     No current facility-administered medications for this visit.    Allergies as of 06/05/2023 - Review Complete 06/05/2023  Allergen Reaction Noted   Naproxen  12/13/2015    Family History  Problem Relation Age of Onset   Asthma Mother    Throat cancer Father    Lung cancer Brother     Stomach cancer Brother    Colon cancer Neg Hx     Social History   Socioeconomic History   Marital status: Married    Spouse name: Not on file   Number of children: Not on file   Years of education: Not on file   Highest education level: Not on file  Occupational History   Not on file  Tobacco Use   Smoking status: Never   Smokeless tobacco: Never  Vaping Use   Vaping status: Never Used  Substance and Sexual Activity   Alcohol use: Not Currently    Comment: Social drink   Drug use: No   Sexual activity: Not on file  Other Topics Concern   Not on file  Social History Narrative   Not on file   Social Drivers of Health   Financial Resource Strain: Not on file  Food Insecurity: Not on file  Transportation Needs: Not on file  Physical Activity: Not on file  Stress: Not on file  Social Connections: Not on file  Intimate Partner Violence: Not on file    Review of Systems: Gen: Denies any fever, chills, cold or flulike symptoms, presyncope, syncope. CV: Denies chest pain, heart palpitations. Resp: Denies shortness of breath, cough. GI: D see HPI GU : Denies urinary burning, urinary frequency, urinary hesitancy MS: Denies joint pain. Derm: Denies rash. Psych: Denies depression, anxiety.. Heme: See HPI  Physical Exam: BP 122/83 (BP Location: Right Arm, Patient Position: Sitting, Cuff Size: Normal)   Pulse 69   Temp 97.8 F (36.6 C) (Temporal)   Ht 5\' 2"  (1.575 m)   Wt 141 lb (64 kg)   BMI 25.79 kg/m  General:   Alert and oriented. Pleasant and cooperative. Well-nourished and well-developed.  Head:  Normocephalic and atraumatic. Eyes:  Without icterus, sclera clear and conjunctiva pink.  Ears:  Normal auditory acuity. Lungs:  Clear to auscultation bilaterally. No wheezes, rales, or rhonchi. No distress.  Heart:  S1, S2 present without murmurs appreciated.  Abdomen:  +BS, soft, and non-distended. Mild "discomfort" to palpation of upper abdomen. No HSM noted. No  guarding or rebound. No masses appreciated.  Rectal:  Deferred  Msk:  Symmetrical without gross deformities. Normal posture. Extremities:  Without edema. Neurologic:  Alert and  oriented x4;  grossly normal neurologically. Skin:  Intact without significant lesions or rashes. Psych:   Normal mood and affect.    Assessment:  78 year old female with history of arthritis, osteoporosis, bladder incontinence, HLD, H. pylori, presenting today for further evaluation of upper abdominal discomfort, bloating, belching without specific food triggers. Denies constipation with MiraLAX every other day.  No unintentional weight loss.  Regarding her history of H. pylori, she tested positive via stool antigen in August 2024 and  was treated per PCP.  Unclear what regimen she took, but states she took 2 pills for 10 days.  She did have nausea, but this resolved, but no other improvement in her GI symptoms. Admits to taking Excedrin Migraine daily.   It is possible that upper GI symptoms are secondary to persistent H. pylori.  May also have gastritis, duodenitis, PUD, esophagitis, GERD.  Less likely malignancy though she does report brother with stomach cancer.    Plan:  Proceed with upper endoscopy with propofol by Dr. Marletta Lor in near future. The risks, benefits, and alternatives have been discussed with the patient in detail. The patient states understanding and desires to proceed.  ASA 2 Offered starting daily PPI, but patient declined pending EGD findings. Avoid NSAIDs is much as possible. Request records from [facility].   Ermalinda Memos, [facility] 06/05/2023

## 2023-06-05 ENCOUNTER — Ambulatory Visit: Admitting: Gastroenterology

## 2023-06-05 ENCOUNTER — Encounter: Payer: Self-pay | Admitting: *Deleted

## 2023-06-05 ENCOUNTER — Telehealth: Payer: Self-pay | Admitting: *Deleted

## 2023-06-05 ENCOUNTER — Encounter: Payer: Self-pay | Admitting: Gastroenterology

## 2023-06-05 VITALS — BP 122/83 | HR 69 | Temp 97.8°F | Ht 62.0 in | Wt 141.0 lb

## 2023-06-05 DIAGNOSIS — Z8619 Personal history of other infectious and parasitic diseases: Secondary | ICD-10-CM

## 2023-06-05 DIAGNOSIS — R1010 Upper abdominal pain, unspecified: Secondary | ICD-10-CM

## 2023-06-05 DIAGNOSIS — R140 Abdominal distension (gaseous): Secondary | ICD-10-CM

## 2023-06-05 DIAGNOSIS — R142 Eructation: Secondary | ICD-10-CM

## 2023-06-05 NOTE — Telephone Encounter (Signed)
 Pt advised that she will receive a telephone call on Monday 06/12/23 from pre-op nurse from [facility].

## 2023-06-05 NOTE — Patient Instructions (Addendum)
 We will get you scheduled for an upper endoscopy with Dr. Marletta Lor in the near future at [facility].   I will plan to see you back in the office after your procedure.  I recommend you limit Excedrin Migraine as much as possible and avoid all other NSAIDs including ibuprofen, Aleve, Advil, BC powders, Goody powders, and anything that says "NSAID" on the package as these medications can cause inflammation of the GI tract.  It was nice to meet you today!  Ermalinda Memos, PA-C [facility]

## 2023-06-05 NOTE — Telephone Encounter (Signed)
 [employer] PA:  Notification or Prior Authorization is not required for the requested services You are not required to submit a notification/prior authorization based on the information provided. If you have general questions about the prior authorization requirements, visit [email or web address] > Clinician Resources > Advance and Admission Notification Requirements. The number above acknowledges your notification. Please write this reference number down for future reference. If you would like to request an organization determination, please call us at [phone]. Decision ID #: [id number]

## 2023-06-06 ENCOUNTER — Encounter: Payer: Self-pay | Admitting: Gastroenterology

## 2023-06-12 ENCOUNTER — Encounter (HOSPITAL_COMMUNITY)
Admission: RE | Admit: 2023-06-12 | Discharge: 2023-06-12 | Disposition: A | Discharge status: Home / Self Care | Source: Ambulatory Visit | Attending: Internal Medicine | Admitting: Internal Medicine

## 2023-06-14 ENCOUNTER — Ambulatory Visit (HOSPITAL_COMMUNITY): Admitting: Anesthesiology

## 2023-06-14 ENCOUNTER — Encounter (HOSPITAL_COMMUNITY): Payer: Self-pay | Admitting: Internal Medicine

## 2023-06-14 ENCOUNTER — Ambulatory Visit (HOSPITAL_COMMUNITY)
Admission: RE | Admit: 2023-06-14 | Discharge: 2023-06-14 | Disposition: A | Discharge status: Home / Self Care | Attending: Internal Medicine | Admitting: Internal Medicine

## 2023-06-14 ENCOUNTER — Encounter (HOSPITAL_COMMUNITY)
Admission: RE | Disposition: A | Discharge status: Home / Self Care | Payer: Self-pay | Source: Home / Self Care | Attending: Internal Medicine

## 2023-06-14 DIAGNOSIS — K2950 Unspecified chronic gastritis without bleeding: Secondary | ICD-10-CM | POA: Insufficient documentation

## 2023-06-14 DIAGNOSIS — R1013 Epigastric pain: Secondary | ICD-10-CM | POA: Insufficient documentation

## 2023-06-14 DIAGNOSIS — K449 Diaphragmatic hernia without obstruction or gangrene: Secondary | ICD-10-CM

## 2023-06-14 DIAGNOSIS — K2970 Gastritis, unspecified, without bleeding: Secondary | ICD-10-CM | POA: Diagnosis not present

## 2023-06-14 DIAGNOSIS — I10 Essential (primary) hypertension: Secondary | ICD-10-CM | POA: Diagnosis not present

## 2023-06-14 HISTORY — PX: ESOPHAGOGASTRODUODENOSCOPY: SHX5428

## 2023-06-14 SURGERY — EGD (ESOPHAGOGASTRODUODENOSCOPY)
Anesthesia: General

## 2023-06-14 MED ORDER — PROPOFOL 10 MG/ML IV BOLUS
INTRAVENOUS | Status: DC | PRN
  Administered 2023-06-14 (×2): 50 mg via INTRAVENOUS

## 2023-06-14 MED ORDER — PANTOPRAZOLE SODIUM 40 MG PO TBEC
40.0000 mg | DELAYED_RELEASE_TABLET | Freq: Every day | ORAL | 11 refills | Status: AC
Start: 2023-06-14 — End: 2024-06-13

## 2023-06-14 MED ORDER — LACTATED RINGERS IV SOLN
INTRAVENOUS | Status: DC | PRN
Start: 2023-06-14 — End: 2023-06-14

## 2023-06-14 MED ORDER — LIDOCAINE HCL (CARDIAC) PF 100 MG/5ML IV SOSY
PREFILLED_SYRINGE | INTRAVENOUS | Status: DC | PRN
  Administered 2023-06-14: 50 mg via INTRAVENOUS

## 2023-06-14 NOTE — Interval H&P Note (Signed)
 History and Physical Interval Note:  06/14/2023 9:55 AM  Julia Blair  has presented today for surgery, with the diagnosis of hx:H pylor,upper abd pain,bloating/belching.  The various methods of treatment have been discussed with the patient and family. After consideration of risks, benefits and other options for treatment, the patient has consented to  Procedure(s) with comments: EGD (ESOPHAGOGASTRODUODENOSCOPY) (N/A) - 11:00 am, asa 2, pt knows to arrive at 8:15 as a surgical intervention.  The patient's history has been reviewed, patient examined, no change in status, stable for surgery.  I have reviewed the patient's chart and labs.  Questions were answered to the patient's satisfaction.     Lanelle Bal

## 2023-06-14 NOTE — Anesthesia Preprocedure Evaluation (Signed)
 Anesthesia Evaluation  Patient identified by MRN, date of birth, ID band Patient awake    Reviewed: Allergy & Precautions, H&P , NPO status , Patient's Chart, lab work & pertinent test results, reviewed documented beta blocker date and time   Airway Mallampati: II  TM Distance: >3 FB Neck ROM: full    Dental no notable dental hx.    Pulmonary neg pulmonary ROS   Pulmonary exam normal breath sounds clear to auscultation       Cardiovascular Exercise Tolerance: Good hypertension, negative cardio ROS  Rhythm:regular Rate:Normal     Neuro/Psych negative neurological ROS  negative psych ROS   GI/Hepatic negative GI ROS, Neg liver ROS,,,  Endo/Other  negative endocrine ROS    Renal/GU negative Renal ROS  negative genitourinary   Musculoskeletal   Abdominal   Peds  Hematology negative hematology ROS (+)   Anesthesia Other Findings   Reproductive/Obstetrics negative OB ROS                             Anesthesia Physical Anesthesia Plan  ASA: 2  Anesthesia Plan: General   Post-op Pain Management:    Induction:   PONV Risk Score and Plan: Propofol infusion  Airway Management Planned:   Additional Equipment:   Intra-op Plan:   Post-operative Plan:   Informed Consent: I have reviewed the patients History and Physical, chart, labs and discussed the procedure including the risks, benefits and alternatives for the proposed anesthesia with the patient or authorized representative who has indicated his/her understanding and acceptance.     Dental Advisory Given  Plan Discussed with: CRNA  Anesthesia Plan Comments:        Anesthesia Quick Evaluation

## 2023-06-14 NOTE — Op Note (Signed)
 [facility] Patient Name: Julia Blair Procedure Date: 06/14/2023 9:52 AM MRN: [id number] Date of Birth: August 27, 1945 Attending MD: Hennie Duos. Marletta Lor , [place], [id number] CSN: [id number] Age: 78 Admit Type: Outpatient Procedure:                Upper GI endoscopy Indications:              Epigastric abdominal pain, Abdominal bloating Providers:                Hennie Duos. Marletta Lor, DO, Edrick Kins, RN, Lennice Sites Technician, Technician Referring MD:              Medicines:                See the Anesthesia note for documentation of the                            administered medications Complications:            No immediate complications. Estimated Blood Loss:     Estimated blood loss was minimal. Procedure:                Pre-Anesthesia Assessment:                           - The anesthesia plan was to use monitored                            anesthesia care (MAC).                           After obtaining informed consent, the endoscope was                            passed under direct vision. Throughout the                            procedure, the patient's blood pressure, pulse, and                            oxygen saturations were monitored continuously. The                            [id number]-[id number] ([id number]) scope was introduced through the                            mouth, and advanced to the second part of duodenum.                            The upper GI endoscopy was accomplished without                            difficulty. The patient tolerated the procedure                            well.  Scope In: 10:14:39 AM Scope Out: 10:17:44 AM Total Procedure Duration: 0 hours 3 minutes 5 seconds  Findings:      A 2 cm hiatal hernia was present.      Patchy mild inflammation characterized by erythema was found in the       gastric body and in the gastric antrum. Biopsies were taken with a cold       forceps for Helicobacter pylori testing.      The  duodenal bulb, first portion of the duodenum and second portion of       the duodenum were normal. Impression:               - 2 cm hiatal hernia.                           - Gastritis. Biopsied.                           - Normal duodenal bulb, first portion of the                            duodenum and second portion of the duodenum. Moderate Sedation:      Per Anesthesia Care Recommendation:           - Patient has a contact number available for                            emergencies. The signs and symptoms of potential                            delayed complications were discussed with the                            patient. Return to normal activities tomorrow.                            Written discharge instructions were provided to the                            patient.                           - Resume previous diet.                           - Continue present medications.                           - Await pathology results.                           - Use Protonix (pantoprazole) 40 mg PO daily.                           - Return to GI clinic in 8 weeks. Procedure Code(s):        --- Professional ---  65784, Esophagogastroduodenoscopy, flexible,                            transoral; with biopsy, single or multiple Diagnosis Code(s):        --- Professional ---                           K44.9, Diaphragmatic hernia without obstruction or                            gangrene                           K29.70, Gastritis, unspecified, without bleeding                           R10.13, Epigastric pain                           R14.0, Abdominal distension (gaseous) CPT copyright 2022 American Medical Association. All rights reserved. The codes documented in this report are preliminary and upon coder review may  be revised to meet current compliance requirements. Hennie Duos. Marletta Lor, DO Hennie Duos. Marletta Lor, DO 06/14/2023 10:20:43 AM This report has been signed  electronically. Number of Addenda: 0

## 2023-06-14 NOTE — Discharge Instructions (Signed)
 EGD Discharge instructions Please read the instructions outlined below and refer to this sheet in the next few weeks. These discharge instructions provide you with general information on caring for yourself after you leave the hospital. Your doctor may also give you specific instructions. While your treatment has been planned according to the most current medical practices available, unavoidable complications occasionally occur. If you have any problems or questions after discharge, please call your doctor. ACTIVITY You may resume your regular activity but move at a slower pace for the next 24 hours.  Take frequent rest periods for the next 24 hours.  Walking will help expel (get rid of) the air and reduce the bloated feeling in your abdomen.  No driving for 24 hours (because of the anesthesia (medicine) used during the test).  You may shower.  Do not sign any important legal documents or operate any machinery for 24 hours (because of the anesthesia used during the test).  NUTRITION Drink plenty of fluids.  You may resume your normal diet.  Begin with a light meal and progress to your normal diet.  Avoid alcoholic beverages for 24 hours or as instructed by your caregiver.  MEDICATIONS You may resume your normal medications unless your caregiver tells you otherwise.  WHAT YOU CAN EXPECT TODAY You may experience abdominal discomfort such as a feeling of fullness or "gas" pains.  FOLLOW-UP Your doctor will discuss the results of your test with you.  SEEK IMMEDIATE MEDICAL ATTENTION IF ANY OF THE FOLLOWING OCCUR: Excessive nausea (feeling sick to your stomach) and/or vomiting.  Severe abdominal pain and distention (swelling).  Trouble swallowing.  Temperature over 101 F (37.8 C).  Rectal bleeding or vomiting of blood.   Your EGD revealed mild amount inflammation in your stomach.  I took biopsies of this to rule out infection with a bacteria called H. pylori.  You have a small hiatal hernia  as well.  Esophagus and small bowel otherwise unremarkable.  Await pathology results, my office will contact you.  Would recommend daily PPI given inflammation seen today.  Follow-up in GI office in 6 to 8 weeks.   I hope you have a great rest of your week!  Hennie Duos. Marletta Lor, D.O. Gastroenterology and Hepatology [facility]

## 2023-06-14 NOTE — Transfer of Care (Signed)
 Immediate Anesthesia Transfer of Care Note  Patient: Julia Blair  Procedure(s) Performed: EGD (ESOPHAGOGASTRODUODENOSCOPY)  Patient Location: Short Stay  Anesthesia Type:General  Level of Consciousness: awake, alert , oriented, and patient cooperative  Airway & Oxygen Therapy: Patient Spontanous Breathing  Post-op Assessment: Report given to RN, Post -op Vital signs reviewed and stable, and Patient moving all extremities X 4  Post vital signs: Reviewed and stable  Last Vitals:  Vitals Value Taken Time  BP 114/55 06/14/23 1023  Temp 36.3 C 06/14/23 1023  Pulse 56 06/14/23 1023  Resp 11 06/14/23 1023  SpO2 97 % 06/14/23 1023    Last Pain:  Vitals:   06/14/23 1023  TempSrc: Oral  PainSc: 0-No pain      Patients Stated Pain Goal: 5 (06/14/23 1023)  Complications: No notable events documented.

## 2023-06-15 ENCOUNTER — Encounter (HOSPITAL_COMMUNITY): Payer: Self-pay | Admitting: Internal Medicine

## 2023-06-15 NOTE — Anesthesia Postprocedure Evaluation (Signed)
 Anesthesia Post Note  Patient: Julia Blair  Procedure(s) Performed: EGD (ESOPHAGOGASTRODUODENOSCOPY)  Patient location during evaluation: Phase II Anesthesia Type: General Level of consciousness: awake Pain management: pain level controlled Vital Signs Assessment: post-procedure vital signs reviewed and stable Respiratory status: spontaneous breathing and respiratory function stable Cardiovascular status: blood pressure returned to baseline and stable Postop Assessment: no headache and no apparent nausea or vomiting Anesthetic complications: no Comments: Late entry   No notable events documented.   Last Vitals:  Vitals:   06/14/23 0913 06/14/23 1023  BP: (!) 167/82 (!) 114/55  Pulse: (!) 57 (!) 56  Resp: 20 11  Temp: 36.8 C (!) 36.3 C  SpO2: 100% 97%    Last Pain:  Vitals:   06/15/23 1531  TempSrc:   PainSc: 0-No pain                 Windell Norfolk

## 2023-06-16 LAB — SURGICAL PATHOLOGY

## 2023-06-25 ENCOUNTER — Telehealth: Payer: Self-pay | Admitting: Gastroenterology

## 2023-06-25 NOTE — Telephone Encounter (Signed)
 Received colonoscopy report from [facility] dated 12/07/2011.  She had 2 polyps in the cecum, 2 polyps in the ascending colon, sessile polyp in the distal transverse colon, and 2 polyps in the sigmoid and rectum.   Recommended 3 year surveillance.   Pathology with 4 hyperplastic polyps and 2 tubular adenomas.   Will need to discuss surveillance colonoscopy at upcoming office visit.

## 2023-07-14 DIAGNOSIS — R051 Acute cough: Secondary | ICD-10-CM | POA: Diagnosis not present

## 2023-07-14 DIAGNOSIS — Z6824 Body mass index (BMI) 24.0-24.9, adult: Secondary | ICD-10-CM | POA: Diagnosis not present

## 2023-07-14 DIAGNOSIS — J019 Acute sinusitis, unspecified: Secondary | ICD-10-CM | POA: Diagnosis not present

## 2023-07-21 DIAGNOSIS — E039 Hypothyroidism, unspecified: Secondary | ICD-10-CM | POA: Diagnosis not present

## 2023-07-25 DIAGNOSIS — R509 Fever, unspecified: Secondary | ICD-10-CM | POA: Diagnosis not present

## 2023-07-25 DIAGNOSIS — R051 Acute cough: Secondary | ICD-10-CM | POA: Diagnosis not present

## 2023-07-25 DIAGNOSIS — Z6824 Body mass index (BMI) 24.0-24.9, adult: Secondary | ICD-10-CM | POA: Diagnosis not present

## 2023-08-01 DIAGNOSIS — R059 Cough, unspecified: Secondary | ICD-10-CM | POA: Diagnosis not present

## 2023-08-01 DIAGNOSIS — Z6823 Body mass index (BMI) 23.0-23.9, adult: Secondary | ICD-10-CM | POA: Diagnosis not present

## 2023-08-07 DIAGNOSIS — M25511 Pain in right shoulder: Secondary | ICD-10-CM | POA: Diagnosis not present

## 2023-08-07 DIAGNOSIS — R9431 Abnormal electrocardiogram [ECG] [EKG]: Secondary | ICD-10-CM | POA: Diagnosis not present

## 2023-08-07 DIAGNOSIS — R9082 White matter disease, unspecified: Secondary | ICD-10-CM | POA: Diagnosis not present

## 2023-08-07 DIAGNOSIS — M25512 Pain in left shoulder: Secondary | ICD-10-CM | POA: Diagnosis not present

## 2023-08-07 DIAGNOSIS — R0602 Shortness of breath: Secondary | ICD-10-CM | POA: Diagnosis not present

## 2023-08-07 DIAGNOSIS — M19011 Primary osteoarthritis, right shoulder: Secondary | ICD-10-CM | POA: Diagnosis not present

## 2023-08-07 DIAGNOSIS — R404 Transient alteration of awareness: Secondary | ICD-10-CM | POA: Diagnosis not present

## 2023-08-07 DIAGNOSIS — M19012 Primary osteoarthritis, left shoulder: Secondary | ICD-10-CM | POA: Diagnosis not present

## 2023-08-07 DIAGNOSIS — M25519 Pain in unspecified shoulder: Secondary | ICD-10-CM | POA: Diagnosis not present

## 2023-08-07 DIAGNOSIS — Z20822 Contact with and (suspected) exposure to covid-19: Secondary | ICD-10-CM | POA: Diagnosis not present

## 2023-08-07 DIAGNOSIS — R531 Weakness: Secondary | ICD-10-CM | POA: Diagnosis not present

## 2023-08-07 NOTE — Progress Notes (Deleted)
 Referring Provider: Leesa Pulling, MD Primary Care Physician:  Leesa Pulling, MD Primary GI Physician: Dr. Mordechai April  No chief complaint on file.   HPI:   Julia Blair is a 78 y.o. female presenting today for follow-up of bloating, belching, upper abdominal discomfort.  Last seen in the office 06/05/23 for initial consult of belching, bloating, upper abdominal pain x 1 year. Occasional heartburn. Reported history of H pylori that was treated at [facility] in 2024.  Excedrin almost every morning for headache.  She was scheduled for EGD.  Offered starting daily PPI, but patient declined.  Recommended NSAID avoidance.  Requested colonoscopy records from [facility].  EGD 06/14/23:  2 cm hiatal hernia, gastritis biopsied, normal examined duodenum.  Recommended Protonix  40 mg daily.. Pathology with chronic gastritis, no H. pylori.   Received colonoscopy report from [facility] 12/07/2011: In the cecum, 2 polyps in the ascending colon, sessile polyp in the distal transverse colon, 2 polyps in the sigmoid and rectum.  Pathology with 4 hyperplastic polyps and 2 tubular adenomas.  Recommended 3-year surveillance.   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;  Surgeon: Arnie Lao, MD;  Location: [facility] OR;  Service: Orthopedics;  Laterality: Left;   CYST EXCISION Right    right arm   ESOPHAGOGASTRODUODENOSCOPY N/A 06/14/2023   Procedure: EGD (ESOPHAGOGASTRODUODENOSCOPY);  Surgeon: Vinetta Greening, DO;  [facility];  Service: Endoscopy;  Laterality: N/A;  11:00 am, asa 2, pt knows to arrive at 8:15   HIP ARTHROPLASTY Left 12/14/2015   Procedure: ARTHROPLASTY BIPOLAR HIP  (HEMIARTHROPLASTY);  Surgeon: Timothy Ford, MD;  Location: [facility] OR;  Service: Orthopedics;  Laterality: Left;    Current Outpatient Medications  Medication Sig Dispense Refill   alendronate (FOSAMAX) 70 MG tablet Take 70 mg by mouth once a week.     aspirin  81 MG chewable tablet Chew 1 tablet (81 mg total) by mouth 2 (two) times daily. 30 tablet 0   aspirin -acetaminophen -caffeine (EXCEDRIN MIGRAINE) 250-250-65 MG tablet Take 2 tablets by mouth every 6 (six) hours as needed for headache or migraine.     Calcium Carbonate-Vitamin D (CALCIUM 600/VITAMIN D PO) Take 1 tablet by mouth daily.     Coenzyme Q10 (CO Q 10) 100 MG CAPS Take 100 mg by mouth daily.     glucosamine-chondroitin 500-400 MG tablet Take 1 tablet by mouth daily.      HYDROcodone -acetaminophen  (NORCO/VICODIN) 5-325 MG tablet Take 1 tablet by mouth every 6 (six) hours as needed for moderate pain. 40 tablet 0   Multiple Vitamin (MULTIVITAMIN) tablet Take 1 tablet by mouth daily.     pantoprazole  (PROTONIX ) 40 MG tablet Take 1 tablet (40 mg total) by mouth daily. 30 tablet 11   polyethylene glycol (MIRALAX  / GLYCOLAX ) 17 g packet Take 17 g by mouth once a week.     rosuvastatin (CRESTOR) 5 MG tablet SMARTSIG:1 Tablet(s) By Mouth Every Evening     traZODone (DESYREL) 50 MG tablet Take 100 mg by mouth at bedtime.     No current facility-administered medications for this visit.    Allergies as of 08/09/2023 - Review Complete 06/14/2023  Allergen Reaction Noted   Naproxen  12/13/2015    Family History  Problem Relation Age of Onset   Asthma Mother    Throat cancer Father    Lung cancer Brother    Stomach cancer Brother    Colon cancer Neg Hx     Social History   Socioeconomic History   Marital status: Married    Spouse name: Not on file   Number of children: Not on file   Years of education: Not on file   Highest education level: Not on file  Occupational History   Not on file  Tobacco Use   Smoking status: Never    Smokeless tobacco: Never  Vaping Use   Vaping status: Never Used  Substance and Sexual Activity   Alcohol  use: Not Currently    Comment: Social drink   Drug use: No   Sexual activity: Not on file  Other Topics Concern   Not on file  Social History Narrative   Not on file   Social Drivers of Health   Financial Resource Strain: Not on file  Food Insecurity: Not on file  Transportation Needs: Not on file  Physical Activity: Not on file  Stress: Not on file  Social Connections: Not on file    Review of Systems: Gen: Denies fever, chills, anorexia. Denies fatigue, weakness, weight loss.  CV: Denies chest pain, palpitations, syncope, peripheral edema, and claudication. Resp: Denies dyspnea at rest, cough, wheezing, coughing up blood, and pleurisy. GI: Denies vomiting blood, jaundice, and fecal incontinence.   Denies dysphagia or odynophagia. Derm: Denies rash, itching, dry skin Psych: Denies depression, anxiety, memory loss, confusion. No homicidal or suicidal ideation.  Heme: Denies bruising, bleeding, and enlarged lymph nodes.  Physical Exam: There were no vitals taken for this visit. General:   Alert and oriented. No distress noted. Pleasant and cooperative.  Head:  Normocephalic and atraumatic. Eyes:  Conjuctiva clear without scleral icterus. Heart:  S1, S2 present without murmurs appreciated. Lungs:  Clear to auscultation bilaterally. No wheezes, rales, or rhonchi. No distress.  Abdomen:  +BS, soft, non-tender and non-distended. No rebound or guarding. No HSM or masses noted. Msk:  Symmetrical without gross deformities. Normal posture. Extremities:  Without edema. Neurologic:  Alert and  oriented x4 Psych:  Normal mood and affect.    Assessment:     Plan:  ***   Shana Daring, PA-C [facility] 08/09/2023

## 2023-08-08 DIAGNOSIS — S80869A Insect bite (nonvenomous), unspecified lower leg, initial encounter: Secondary | ICD-10-CM | POA: Diagnosis not present

## 2023-08-08 DIAGNOSIS — R300 Dysuria: Secondary | ICD-10-CM | POA: Diagnosis not present

## 2023-08-08 DIAGNOSIS — E039 Hypothyroidism, unspecified: Secondary | ICD-10-CM | POA: Diagnosis not present

## 2023-08-08 DIAGNOSIS — Z6824 Body mass index (BMI) 24.0-24.9, adult: Secondary | ICD-10-CM | POA: Diagnosis not present

## 2023-08-08 DIAGNOSIS — D529 Folate deficiency anemia, unspecified: Secondary | ICD-10-CM | POA: Diagnosis not present

## 2023-08-08 DIAGNOSIS — M2550 Pain in unspecified joint: Secondary | ICD-10-CM | POA: Diagnosis not present

## 2023-08-08 DIAGNOSIS — E559 Vitamin D deficiency, unspecified: Secondary | ICD-10-CM | POA: Diagnosis not present

## 2023-08-08 DIAGNOSIS — D519 Vitamin B12 deficiency anemia, unspecified: Secondary | ICD-10-CM | POA: Diagnosis not present

## 2023-08-09 ENCOUNTER — Ambulatory Visit: Admitting: Gastroenterology

## 2023-08-15 DIAGNOSIS — G8929 Other chronic pain: Secondary | ICD-10-CM | POA: Diagnosis not present

## 2023-08-15 DIAGNOSIS — M25511 Pain in right shoulder: Secondary | ICD-10-CM | POA: Diagnosis not present

## 2023-08-15 DIAGNOSIS — M25512 Pain in left shoulder: Secondary | ICD-10-CM | POA: Diagnosis not present

## 2023-09-04 DIAGNOSIS — E785 Hyperlipidemia, unspecified: Secondary | ICD-10-CM | POA: Diagnosis not present

## 2023-09-04 DIAGNOSIS — K5900 Constipation, unspecified: Secondary | ICD-10-CM | POA: Diagnosis not present

## 2023-09-04 DIAGNOSIS — Z6823 Body mass index (BMI) 23.0-23.9, adult: Secondary | ICD-10-CM | POA: Diagnosis not present

## 2023-09-04 DIAGNOSIS — Z8781 Personal history of (healed) traumatic fracture: Secondary | ICD-10-CM | POA: Diagnosis not present

## 2023-09-04 DIAGNOSIS — M545 Low back pain, unspecified: Secondary | ICD-10-CM | POA: Diagnosis not present

## 2023-09-10 NOTE — Progress Notes (Unsigned)
 Referring Provider: Toribio Jerel MATSU, MD Primary Care Physician:  Toribio Jerel MATSU, MD Primary GI Physician: Dr. Cindie  No chief complaint on file.   HPI:   Julia Blair is a 78 y.o. female  presenting today for follow-up of bloating, belching, upper abdominal discomfort.   Last seen in the office 06/05/23 for initial consult of belching, bloating, upper abdominal pain x 1 year. Occasional heartburn. Reported history of H pylori that was treated at [facility] in 2024.  Excedrin almost every morning for headache.  She was scheduled for EGD.  Offered starting daily PPI, but patient declined.  Recommended NSAID avoidance.  Requested colonoscopy records from [facility].   EGD 06/14/23:  2 cm hiatal hernia, gastritis biopsied, normal examined duodenum.  Recommended Protonix  40 mg daily.. Pathology with chronic gastritis, no H. pylori.     Received colonoscopy report from [facility] 12/07/2011: In the cecum, 2 polyps in the ascending colon, sessile polyp in the distal transverse colon, 2 polyps in the sigmoid and rectum.  Pathology with 4 hyperplastic polyps and 2 tubular adenomas.  Recommended 3-year surveillance.     Today:   Past Medical History:  Diagnosis Date   Arthritis    Bladder incontinence    High cholesterol    Osteoporosis     Past Surgical History:  Procedure Laterality Date   ABDOMINAL HYSTERECTOMY     ANTERIOR HIP REVISION Left 09/18/2018   Procedure: Conversion of Left Hip Hemiarthroplasty to Total Hip;  Surgeon: Vernetta Lonni GRADE, MD;  Location: [facility] OR;  Service: Orthopedics;  Laterality: Left;   CYST EXCISION Right    right arm   ESOPHAGOGASTRODUODENOSCOPY N/A 06/14/2023   Procedure: EGD (ESOPHAGOGASTRODUODENOSCOPY);  Surgeon: Cindie Carlin POUR, DO;  [facility];  Service: Endoscopy;  Laterality: N/A;  11:00 am, asa 2, pt knows to arrive at 8:15   HIP ARTHROPLASTY Left 12/14/2015   Procedure: ARTHROPLASTY BIPOLAR HIP  (HEMIARTHROPLASTY);  Surgeon: Jerona LULLA Sage, MD;  Location: [facility] OR;  Service: Orthopedics;  Laterality: Left;    Current Outpatient Medications  Medication Sig Dispense Refill   alendronate (FOSAMAX) 70 MG tablet Take 70 mg by mouth once a week.     aspirin  81 MG chewable tablet Chew 1 tablet (81 mg total) by mouth 2 (two) times daily. 30 tablet 0   aspirin -acetaminophen -caffeine (EXCEDRIN MIGRAINE) 250-250-65 MG tablet Take 2 tablets by mouth every 6 (six) hours as needed for headache or migraine.     Calcium Carbonate-Vitamin D (CALCIUM 600/VITAMIN D PO) Take 1 tablet by mouth daily.     Coenzyme Q10 (CO Q 10) 100 MG CAPS Take 100 mg by mouth daily.     glucosamine-chondroitin 500-400 MG tablet Take 1 tablet by mouth daily.      HYDROcodone -acetaminophen  (NORCO/VICODIN) 5-325 MG tablet Take 1 tablet by mouth every 6 (six) hours as needed for moderate pain. 40 tablet 0   Multiple Vitamin (MULTIVITAMIN) tablet Take 1 tablet by mouth daily.     pantoprazole  (PROTONIX ) 40 MG tablet Take 1 tablet (40 mg total) by mouth daily. 30 tablet 11   polyethylene glycol (MIRALAX  / GLYCOLAX ) 17 g packet Take 17 g by mouth once a week.     rosuvastatin (CRESTOR) 5 MG tablet SMARTSIG:1 Tablet(s) By Mouth Every Evening     traZODone (DESYREL) 50 MG tablet Take 100 mg by mouth at bedtime.     No current facility-administered medications for this visit.    Allergies as of  09/13/2023 - Review Complete 06/14/2023  Allergen Reaction Noted   Naproxen  12/13/2015    Family History  Problem Relation Age of Onset   Asthma Mother    Throat cancer Father    Lung cancer Brother    Stomach cancer Brother    Colon cancer Neg Hx     Social History   Socioeconomic History   Marital status: Married    Spouse name: Not on file   Number of children: Not on file   Years of education: Not on file   Highest education level: Not on file  Occupational History   Not on file  Tobacco Use   Smoking status: Never    Smokeless tobacco: Never  Vaping Use   Vaping status: Never Used  Substance and Sexual Activity   Alcohol  use: Not Currently    Comment: Social drink   Drug use: No   Sexual activity: Not on file  Other Topics Concern   Not on file  Social History Narrative   Not on file   Social Drivers of Health   Financial Resource Strain: Not on file  Food Insecurity: Not on file  Transportation Needs: Not on file  Physical Activity: Not on file  Stress: Not on file  Social Connections: Not on file    Review of Systems: Gen: Denies fever, chills, anorexia. Denies fatigue, weakness, weight loss.  CV: Denies chest pain, palpitations, syncope, peripheral edema, and claudication. Resp: Denies dyspnea at rest, cough, wheezing, coughing up blood, and pleurisy. GI: Denies vomiting blood, jaundice, and fecal incontinence.   Denies dysphagia or odynophagia. Derm: Denies rash, itching, dry skin Psych: Denies depression, anxiety, memory loss, confusion. No homicidal or suicidal ideation.  Heme: Denies bruising, bleeding, and enlarged lymph nodes.  Physical Exam: There were no vitals taken for this visit. General:   Alert and oriented. No distress noted. Pleasant and cooperative.  Head:  Normocephalic and atraumatic. Eyes:  Conjuctiva clear without scleral icterus. Heart:  S1, S2 present without murmurs appreciated. Lungs:  Clear to auscultation bilaterally. No wheezes, rales, or rhonchi. No distress.  Abdomen:  +BS, soft, non-tender and non-distended. No rebound or guarding. No HSM or masses noted. Msk:  Symmetrical without gross deformities. Normal posture. Extremities:  Without edema. Neurologic:  Alert and  oriented x4 Psych:  Normal mood and affect.    Assessment:     Plan:  ***   Josette Centers, PA-C [facility] 09/13/2023

## 2023-09-13 ENCOUNTER — Other Ambulatory Visit: Payer: Self-pay | Admitting: *Deleted

## 2023-09-13 ENCOUNTER — Encounter: Payer: Self-pay | Admitting: *Deleted

## 2023-09-13 ENCOUNTER — Encounter: Payer: Self-pay | Admitting: Gastroenterology

## 2023-09-13 ENCOUNTER — Ambulatory Visit: Admitting: Gastroenterology

## 2023-09-13 VITALS — BP 130/75 | HR 70 | Temp 98.2°F | Ht 63.0 in | Wt 134.8 lb

## 2023-09-13 DIAGNOSIS — R12 Heartburn: Secondary | ICD-10-CM

## 2023-09-13 DIAGNOSIS — Z860102 Personal history of hyperplastic colon polyps: Secondary | ICD-10-CM

## 2023-09-13 DIAGNOSIS — K5909 Other constipation: Secondary | ICD-10-CM

## 2023-09-13 DIAGNOSIS — R1010 Upper abdominal pain, unspecified: Secondary | ICD-10-CM

## 2023-09-13 DIAGNOSIS — Z860101 Personal history of adenomatous and serrated colon polyps: Secondary | ICD-10-CM | POA: Diagnosis not present

## 2023-09-13 DIAGNOSIS — K5900 Constipation, unspecified: Secondary | ICD-10-CM

## 2023-09-13 DIAGNOSIS — K2950 Unspecified chronic gastritis without bleeding: Secondary | ICD-10-CM

## 2023-09-13 DIAGNOSIS — Z860100 Personal history of colon polyps, unspecified: Secondary | ICD-10-CM

## 2023-09-13 DIAGNOSIS — K449 Diaphragmatic hernia without obstruction or gangrene: Secondary | ICD-10-CM | POA: Diagnosis not present

## 2023-09-13 MED ORDER — PEG 3350-KCL-NA BICARB-NACL 420 G PO SOLR: 4000.0000 mL | Freq: Once | ORAL | 0 refills | Status: AC

## 2023-09-13 NOTE — Patient Instructions (Addendum)
 I am glad you are feeling better overall!   Continue taking pantoprazole  40 mg daily, 30 minutes before breakfast.   Limit Excedrin Migraine is much as possible and avoid all other NSAID products including ibuprofen, Aleve, Advil, BC powders, Goody powders, and anything that says NSAID on the package.  I recommend using Tylenol  first for pain.  No more than 3000 mg of Tylenol  per 24 hours.  To avoid heartburn:  Avoid fried, fatty, greasy, spicy, citrus foods. Avoid caffeine and carbonated beverages. Avoid chocolate. Try eating 4-6 small meals a day rather than 3 large meals. Do not eat within 3 hours of laying down. Prop head of bed up on wood or bricks to create a 6 inch incline.   We will get you scheduled for a colonoscopy with Dr. Cindie.   I will see you back in 4 months or sooner if needed.   Josette Centers, PA-C [facility]

## 2023-09-18 DIAGNOSIS — M7500 Adhesive capsulitis of unspecified shoulder: Secondary | ICD-10-CM | POA: Diagnosis not present

## 2023-09-18 DIAGNOSIS — M25511 Pain in right shoulder: Secondary | ICD-10-CM | POA: Diagnosis not present

## 2023-09-18 DIAGNOSIS — G8929 Other chronic pain: Secondary | ICD-10-CM | POA: Diagnosis not present

## 2023-09-18 DIAGNOSIS — M25512 Pain in left shoulder: Secondary | ICD-10-CM | POA: Diagnosis not present

## 2023-09-18 DIAGNOSIS — R531 Weakness: Secondary | ICD-10-CM | POA: Diagnosis not present

## 2023-09-18 DIAGNOSIS — Z789 Other specified health status: Secondary | ICD-10-CM | POA: Diagnosis not present

## 2023-09-26 DIAGNOSIS — M7500 Adhesive capsulitis of unspecified shoulder: Secondary | ICD-10-CM | POA: Diagnosis not present

## 2023-09-26 DIAGNOSIS — R531 Weakness: Secondary | ICD-10-CM | POA: Diagnosis not present

## 2023-09-26 DIAGNOSIS — M25511 Pain in right shoulder: Secondary | ICD-10-CM | POA: Diagnosis not present

## 2023-09-26 DIAGNOSIS — Z789 Other specified health status: Secondary | ICD-10-CM | POA: Diagnosis not present

## 2023-09-26 DIAGNOSIS — M25512 Pain in left shoulder: Secondary | ICD-10-CM | POA: Diagnosis not present

## 2023-09-26 DIAGNOSIS — G8929 Other chronic pain: Secondary | ICD-10-CM | POA: Diagnosis not present

## 2023-09-29 ENCOUNTER — Encounter (HOSPITAL_COMMUNITY)
Admission: RE | Admit: 2023-09-29 | Discharge: 2023-09-29 | Disposition: A | Discharge status: Home / Self Care | Source: Ambulatory Visit | Attending: Internal Medicine | Admitting: Internal Medicine

## 2023-09-29 ENCOUNTER — Encounter (HOSPITAL_COMMUNITY): Payer: Self-pay

## 2023-09-29 ENCOUNTER — Other Ambulatory Visit: Payer: Self-pay

## 2023-09-29 DIAGNOSIS — M25512 Pain in left shoulder: Secondary | ICD-10-CM | POA: Diagnosis not present

## 2023-09-29 DIAGNOSIS — M25511 Pain in right shoulder: Secondary | ICD-10-CM | POA: Diagnosis not present

## 2023-09-29 DIAGNOSIS — R531 Weakness: Secondary | ICD-10-CM | POA: Diagnosis not present

## 2023-09-29 DIAGNOSIS — Z789 Other specified health status: Secondary | ICD-10-CM | POA: Diagnosis not present

## 2023-09-29 DIAGNOSIS — M7500 Adhesive capsulitis of unspecified shoulder: Secondary | ICD-10-CM | POA: Diagnosis not present

## 2023-09-29 DIAGNOSIS — G8929 Other chronic pain: Secondary | ICD-10-CM | POA: Diagnosis not present

## 2023-09-29 HISTORY — DX: Gastro-esophageal reflux disease without esophagitis: K21.9

## 2023-10-02 ENCOUNTER — Encounter (HOSPITAL_COMMUNITY)
Admission: RE | Disposition: A | Discharge status: Home / Self Care | Payer: Self-pay | Source: Home / Self Care | Attending: Internal Medicine

## 2023-10-02 ENCOUNTER — Ambulatory Visit (HOSPITAL_COMMUNITY): Admitting: Anesthesiology

## 2023-10-02 ENCOUNTER — Encounter (HOSPITAL_COMMUNITY): Payer: Self-pay | Admitting: Internal Medicine

## 2023-10-02 ENCOUNTER — Ambulatory Visit (HOSPITAL_COMMUNITY)
Admission: RE | Admit: 2023-10-02 | Discharge: 2023-10-02 | Disposition: A | Discharge status: Home / Self Care | Attending: Internal Medicine | Admitting: Internal Medicine

## 2023-10-02 DIAGNOSIS — I10 Essential (primary) hypertension: Secondary | ICD-10-CM | POA: Insufficient documentation

## 2023-10-02 DIAGNOSIS — K5730 Diverticulosis of large intestine without perforation or abscess without bleeding: Secondary | ICD-10-CM | POA: Diagnosis not present

## 2023-10-02 DIAGNOSIS — D123 Benign neoplasm of transverse colon: Secondary | ICD-10-CM | POA: Diagnosis not present

## 2023-10-02 DIAGNOSIS — K219 Gastro-esophageal reflux disease without esophagitis: Secondary | ICD-10-CM | POA: Insufficient documentation

## 2023-10-02 DIAGNOSIS — K648 Other hemorrhoids: Secondary | ICD-10-CM | POA: Diagnosis not present

## 2023-10-02 DIAGNOSIS — Z1211 Encounter for screening for malignant neoplasm of colon: Secondary | ICD-10-CM

## 2023-10-02 DIAGNOSIS — Z860101 Personal history of adenomatous and serrated colon polyps: Secondary | ICD-10-CM | POA: Diagnosis not present

## 2023-10-02 DIAGNOSIS — K635 Polyp of colon: Secondary | ICD-10-CM | POA: Insufficient documentation

## 2023-10-02 DIAGNOSIS — Z860100 Personal history of colon polyps, unspecified: Secondary | ICD-10-CM | POA: Diagnosis not present

## 2023-10-02 HISTORY — PX: COLONOSCOPY: SHX5424

## 2023-10-02 SURGERY — COLONOSCOPY
Anesthesia: General

## 2023-10-02 MED ORDER — LACTATED RINGERS IV SOLN: INTRAVENOUS | Status: DC

## 2023-10-02 MED ORDER — PROPOFOL 500 MG/50ML IV EMUL
INTRAVENOUS | Status: DC | PRN
  Administered 2023-10-02: 90 mg via INTRAVENOUS
  Administered 2023-10-02: 100 ug/kg/min via INTRAVENOUS

## 2023-10-02 NOTE — Anesthesia Preprocedure Evaluation (Signed)
 Anesthesia Evaluation  Patient identified by MRN, date of birth, ID band Patient awake    Reviewed: Allergy & Precautions, H&P , NPO status , Patient's Chart, lab work & pertinent test results, reviewed documented beta blocker date and time   Airway Mallampati: II  TM Distance: >3 FB Neck ROM: full    Dental no notable dental hx.    Pulmonary neg pulmonary ROS   Pulmonary exam normal breath sounds clear to auscultation       Cardiovascular Exercise Tolerance: Good hypertension, negative cardio ROS  Rhythm:regular Rate:Normal     Neuro/Psych negative neurological ROS  negative psych ROS   GI/Hepatic negative GI ROS, Neg liver ROS,GERD  ,,  Endo/Other  negative endocrine ROS    Renal/GU negative Renal ROS  negative genitourinary   Musculoskeletal   Abdominal   Peds  Hematology negative hematology ROS (+)   Anesthesia Other Findings   Reproductive/Obstetrics negative OB ROS                             Anesthesia Physical Anesthesia Plan  ASA: 2  Anesthesia Plan: General   Post-op Pain Management:    Induction:   PONV Risk Score and Plan: Propofol infusion  Airway Management Planned:   Additional Equipment:   Intra-op Plan:   Post-operative Plan:   Informed Consent: I have reviewed the patients History and Physical, chart, labs and discussed the procedure including the risks, benefits and alternatives for the proposed anesthesia with the patient or authorized representative who has indicated his/her understanding and acceptance.     Dental Advisory Given  Plan Discussed with: CRNA  Anesthesia Plan Comments:        Anesthesia Quick Evaluation

## 2023-10-02 NOTE — Transfer of Care (Signed)
 Immediate Anesthesia Transfer of Care Note  Patient: Julia Blair  Procedure(s) Performed: COLONOSCOPY  Patient Location: Short Stay  Anesthesia Type:General  Level of Consciousness: drowsy  Airway & Oxygen  Therapy: Patient Spontanous Breathing  Post-op Assessment: Report given to RN and Post -op Vital signs reviewed and stable  Post vital signs: Reviewed and stable  Last Vitals:  Vitals Value Taken Time  BP 103/46 10/02/23 08:57  Temp 36.6 C 10/02/23 08:57  Pulse 57 10/02/23 08:57  Resp 10 10/02/23 08:57  SpO2 100 % 10/02/23 08:57    Last Pain:  Vitals:   10/02/23 0857  TempSrc: Oral  PainSc: 0-No pain      Patients Stated Pain Goal: 7 (10/02/23 0758)  Complications: No notable events documented.

## 2023-10-02 NOTE — H&P (Signed)
 Primary Care Physician:  Toribio Jerel MATSU, MD Primary Gastroenterologist:  Dr. Cindie  Pre-Procedure History & Physical: HPI:  Julia Blair is a 78 y.o. female is here for a colonoscopy to be performed for surveillance purposes, personal history of adenomatous colon polyps in 2013  Past Medical History:  Diagnosis Date   Arthritis    Bladder incontinence    GERD (gastroesophageal reflux disease)    High cholesterol    Osteoporosis     Past Surgical History:  Procedure Laterality Date   ABDOMINAL HYSTERECTOMY     ANTERIOR HIP REVISION Left 09/18/2018   Procedure: Conversion of Left Hip Hemiarthroplasty to Total Hip;  Surgeon: Vernetta Lonni GRADE, MD;  Location: [facility] OR;  Service: Orthopedics;  Laterality: Left;   CYST EXCISION Right    right arm   ESOPHAGOGASTRODUODENOSCOPY N/A 06/14/2023   Surgeon: Cindie Carlin POUR, DO; 2 cm hiatal hernia, gastritis biopsied, normal examined duodenum.  Recommended Protonix  40 mg daily.. Pathology with chronic gastritis, no H. pylori.   HIP ARTHROPLASTY Left 12/14/2015   Procedure: ARTHROPLASTY BIPOLAR HIP (HEMIARTHROPLASTY);  Surgeon: Jerona LULLA Sage, MD;  [facility];  Service: Orthopedics;  Laterality: Left;   HYSTEROSCOPY      Prior to Admission medications   Medication Sig Start Date End Date Taking? Authorizing Provider  aspirin -acetaminophen -caffeine (EXCEDRIN MIGRAINE) 250-250-65 MG tablet Take 2 tablets by mouth every 6 (six) hours as needed for headache or migraine.   Yes [provider]  Calcium Carbonate-Vitamin D (CALCIUM 600/VITAMIN D PO) Take 1 tablet by mouth daily.   Yes [provider]  Coenzyme Q10 (CO Q 10) 100 MG CAPS Take 100 mg by mouth daily.   Yes [provider]  Multiple Vitamin (MULTIVITAMIN) tablet Take 1 tablet by mouth daily.   Yes [provider]  Omega 3 1000 MG CAPS Take by mouth.   Yes [provider]  pantoprazole  (PROTONIX ) 40 MG tablet Take 1 tablet (40 mg  total) by mouth daily. 06/14/23 06/13/24 Yes Carnell Beavers K, DO  rosuvastatin (CRESTOR) 5 MG tablet SMARTSIG:1 Tablet(s) By Mouth Every Evening 09/10/19  Yes [provider]  traMADol (ULTRAM) 50 MG tablet Take 50 mg by mouth at bedtime as needed. 09/04/23  Yes [provider]  traZODone (DESYREL) 150 MG tablet Take 150 mg by mouth at bedtime. 07/26/23  Yes [provider]  aspirin  81 MG chewable tablet Chew 1 tablet (81 mg total) by mouth 2 (two) times daily. 09/20/18   Vernetta Lonni GRADE, MD  polyethylene glycol (MIRALAX  / GLYCOLAX ) 17 g packet Take 17 g by mouth once a week.    [provider]    Allergies as of 09/13/2023 - Review Complete 09/13/2023  Allergen Reaction Noted   Naproxen  12/13/2015    Family History  Problem Relation Age of Onset   Asthma Mother    Throat cancer Father    Lung cancer Brother    Stomach cancer Brother    Colon cancer Neg Hx     Social History   Socioeconomic History   Marital status: Married    Spouse name: Not on file   Number of children: Not on file   Years of education: Not on file   Highest education level: Not on file  Occupational History   Not on file  Tobacco Use   Smoking status: Never   Smokeless tobacco: Never  Vaping Use   Vaping status: Never Used  Substance and Sexual Activity   Alcohol   use: Not Currently    Comment: Social drink   Drug use: No   Sexual activity: Not on file  Other Topics Concern   Not on file  Social History Narrative   Not on file   Social Drivers of Health   Financial Resource Strain: Not on file  Food Insecurity: Not on file  Transportation Needs: Not on file  Physical Activity: Not on file  Stress: Not on file  Social Connections: Not on file  Intimate Partner Violence: Not on file    Review of Systems: See HPI, otherwise negative ROS  Physical Exam: Vital signs in last 24 hours: Temp:  [98.1 F (36.7 C)] 98.1 F (36.7 C) (07/28 0758) Pulse  Rate:  [66] 66 (07/28 0758) Resp:  [18] 18 (07/28 0758) BP: (146)/(72) 146/72 (07/28 0758) SpO2:  [100 %] 100 % (07/28 0758)   General:   Alert,  Well-developed, well-nourished, pleasant and cooperative in NAD Head:  Normocephalic and atraumatic. Eyes:  Sclera clear, no icterus.   Conjunctiva pink. Ears:  Normal auditory acuity. Nose:  No deformity, discharge,  or lesions. Msk:  Symmetrical without gross deformities. Normal posture. Extremities:  Without clubbing or edema. Neurologic:  Alert and  oriented x4;  grossly normal neurologically. Skin:  Intact without significant lesions or rashes. Psych:  Alert and cooperative. Normal mood and affect.  Impression/Plan: Julia Blair is here for a colonoscopy to be performed for surveillance purposes, personal history of adenomatous colon polyps in 2013  The risks of the procedure including infection, bleed, or perforation as well as benefits, limitations, alternatives and imponderables have been reviewed with the patient. Questions have been answered. All parties agreeable.

## 2023-10-02 NOTE — Op Note (Signed)
 [facility] Patient Name: Janaisha Tolsma Procedure Date: 10/02/2023 8:09 AM MRN: [id number] Date of Birth: [birth year] Attending MD: Carlin POUR. Cindie , [place], [id number] CSN: [id number] Age: 78 Admit Type: Outpatient Procedure:                Colonoscopy Indications:              Surveillance: Personal history of adenomatous                            polyps on last colonoscopy > 5 years ago Providers:                Carlin POUR. Cindie, DO, Devere Lodge, [place],                            Technician, Daphne Mulch Technician, Technician Referring MD:              Medicines:                See the Anesthesia note for documentation of the                            administered medications Complications:            No immediate complications. Estimated Blood Loss:     Estimated blood loss was minimal. Procedure:                Pre-Anesthesia Assessment:                           - The anesthesia plan was to use monitored                            anesthesia care (MAC).                           After obtaining informed consent, the colonoscope                            was passed under direct vision. Throughout the                            procedure, the patient's blood pressure, pulse, and                            oxygen  saturations were monitored continuously. The                            [id number]-HQ190L ([id number]) scope was introduced through                            the anus and advanced to the the cecum, identified                            by appendiceal orifice and ileocecal valve. The  colonoscopy was performed without difficulty. The                            patient tolerated the procedure well. The quality                            of the bowel preparation was evaluated using the                            BBPS [facility] Bowel Preparation Scale) with scores                            of: Right Colon = 3, Transverse Colon = 3 and Left                             Colon = 3 (entire mucosa seen well with no residual                            staining, small fragments of stool or opaque                            liquid). The total BBPS score equals 9. Scope In: 8:35:29 AM Scope Out: 8:53:48 AM Scope Withdrawal Time: 0 hours 12 minutes 1 second  Total Procedure Duration: 0 hours 18 minutes 19 seconds  Findings:      Non-bleeding internal hemorrhoids were found.      Multiple medium-mouthed and small-mouthed diverticula were found in the       sigmoid colon.      A 6 mm polyp was found in the transverse colon. The polyp was sessile.       The polyp was removed with a cold snare. Resection and retrieval were       complete.      The exam was otherwise without abnormality. Impression:               - Non-bleeding internal hemorrhoids.                           - Diverticulosis in the sigmoid colon.                           - One 6 mm polyp in the transverse colon, removed                            with a cold snare. Resected and retrieved.                           - The examination was otherwise normal. Moderate Sedation:      Per Anesthesia Care Recommendation:           - Patient has a contact number available for                            emergencies. The signs and symptoms of potential  delayed complications were discussed with the                            patient. Return to normal activities tomorrow.                            Written discharge instructions were provided to the                            patient.                           - Resume previous diet.                           - Continue present medications.                           - Await pathology results.                           - No repeat colonoscopy due to age.                           - Return to GI clinic in 4 months. Procedure Code(s):        --- Professional ---                           [phone], Colonoscopy, flexible;  with removal of                            tumor(s), polyp(s), or other lesion(s) by snare                            technique Diagnosis Code(s):        --- Professional ---                           Z86.010, Personal history of colonic polyps                           K64.8, Other hemorrhoids                           D12.3, Benign neoplasm of transverse colon (hepatic                            flexure or splenic flexure)                           K57.30, Diverticulosis of large intestine without                            perforation or abscess without bleeding CPT copyright 2022 American Medical Association. All rights reserved. The codes documented in this report are preliminary and upon coder review may  be revised to meet current compliance requirements. Carlin POUR. Cindie, DO  Carlin POUR. Cindie, DO 10/02/2023 8:56:13 AM This report has been signed electronically. Number of Addenda: 0

## 2023-10-02 NOTE — Discharge Instructions (Addendum)
  Colonoscopy Discharge Instructions  Read the instructions outlined below and refer to this sheet in the next few weeks. These discharge instructions provide you with general information on caring for yourself after you leave the hospital. Your doctor may also give you specific instructions. While your treatment has been planned according to the most current medical practices available, unavoidable complications occasionally occur.   ACTIVITY You may resume your regular activity, but move at a slower pace for the next 24 hours.  Take frequent rest periods for the next 24 hours.  Walking will help get rid of the air and reduce the bloated feeling in your belly (abdomen).  No driving for 24 hours (because of the medicine (anesthesia) used during the test).   Do not sign any important legal documents or operate any machinery for 24 hours (because of the anesthesia used during the test).  NUTRITION Drink plenty of fluids.  You may resume your normal diet as instructed by your doctor.  Begin with a light meal and progress to your normal diet. Heavy or fried foods are harder to digest and may make you feel sick to your stomach (nauseated).  Avoid alcoholic beverages for 24 hours or as instructed.  MEDICATIONS You may resume your normal medications unless your doctor tells you otherwise.  WHAT YOU CAN EXPECT TODAY Some feelings of bloating in the abdomen.  Passage of more gas than usual.  Spotting of blood in your stool or on the toilet paper.  IF YOU HAD POLYPS REMOVED DURING THE COLONOSCOPY: No aspirin  products for 7 days or as instructed.  No alcohol  for 7 days or as instructed.  Eat a soft diet for the next 24 hours.  FINDING OUT THE RESULTS OF YOUR TEST Not all test results are available during your visit. If your test results are not back during the visit, make an appointment with your caregiver to find out the results. Do not assume everything is normal if you have not heard from your  caregiver or the medical facility. It is important for you to follow up on all of your test results.  SEEK IMMEDIATE MEDICAL ATTENTION IF: You have more than a spotting of blood in your stool.  Your belly is swollen (abdominal distention).  You are nauseated or vomiting.  You have a temperature over 101.  You have abdominal pain or discomfort that is severe or gets worse throughout the day.   Your colonoscopy revealed 1 polyp(s) which I removed successfully. Await pathology results, my office will contact you.  Given your age, I do not think you need further colonoscopies for polyp surveillance.  You also have diverticulosis and internal hemorrhoids. I would recommend increasing fiber in your diet or adding OTC Benefiber/Metamucil. Be sure to drink at least 4 to 6 glasses of water daily. Follow-up with GI in 4 months   I hope you have a great rest of your week!  Carlin POUR. Cindie, D.O. Gastroenterology and Hepatology [facility]

## 2023-10-02 NOTE — Anesthesia Procedure Notes (Signed)
 Date/Time: 10/02/2023 8:29 AM  Performed by: Barbarann Verneita RAMAN, CRNAPre-anesthesia Checklist: Patient identified, Emergency Drugs available, Suction available, Timeout performed and Patient being monitored Patient Re-evaluated:Patient Re-evaluated prior to induction Oxygen  Delivery Method: Nasal Cannula

## 2023-10-03 ENCOUNTER — Encounter (HOSPITAL_COMMUNITY): Payer: Self-pay | Admitting: Internal Medicine

## 2023-10-03 DIAGNOSIS — M7500 Adhesive capsulitis of unspecified shoulder: Secondary | ICD-10-CM | POA: Diagnosis not present

## 2023-10-03 DIAGNOSIS — M25511 Pain in right shoulder: Secondary | ICD-10-CM | POA: Diagnosis not present

## 2023-10-03 DIAGNOSIS — M25512 Pain in left shoulder: Secondary | ICD-10-CM | POA: Diagnosis not present

## 2023-10-03 DIAGNOSIS — G8929 Other chronic pain: Secondary | ICD-10-CM | POA: Diagnosis not present

## 2023-10-03 DIAGNOSIS — Z789 Other specified health status: Secondary | ICD-10-CM | POA: Diagnosis not present

## 2023-10-03 DIAGNOSIS — R531 Weakness: Secondary | ICD-10-CM | POA: Diagnosis not present

## 2023-10-03 LAB — SURGICAL PATHOLOGY

## 2023-10-03 NOTE — Anesthesia Postprocedure Evaluation (Signed)
 Anesthesia Post Note  Patient: Julia Blair  Procedure(s) Performed: COLONOSCOPY  Patient location during evaluation: Phase II Anesthesia Type: General Level of consciousness: awake Pain management: pain level controlled Vital Signs Assessment: post-procedure vital signs reviewed and stable Respiratory status: spontaneous breathing and respiratory function stable Cardiovascular status: blood pressure returned to baseline and stable Postop Assessment: no headache and no apparent nausea or vomiting Anesthetic complications: no Comments: Late entry   No notable events documented.   Last Vitals:  Vitals:   10/02/23 0857 10/02/23 0900  BP: (!) 103/46 (!) 113/54  Pulse: (!) 57   Resp: 10   Temp: 36.6 C   SpO2: 100%     Last Pain:  Vitals:   10/02/23 0857  TempSrc: Oral  PainSc: 0-No pain                 Yvonna JINNY Julia Blair

## 2023-10-05 DIAGNOSIS — R531 Weakness: Secondary | ICD-10-CM | POA: Diagnosis not present

## 2023-10-05 DIAGNOSIS — M7500 Adhesive capsulitis of unspecified shoulder: Secondary | ICD-10-CM | POA: Diagnosis not present

## 2023-10-05 DIAGNOSIS — M25511 Pain in right shoulder: Secondary | ICD-10-CM | POA: Diagnosis not present

## 2023-10-05 DIAGNOSIS — M25512 Pain in left shoulder: Secondary | ICD-10-CM | POA: Diagnosis not present

## 2023-10-05 DIAGNOSIS — Z789 Other specified health status: Secondary | ICD-10-CM | POA: Diagnosis not present

## 2023-10-05 DIAGNOSIS — G8929 Other chronic pain: Secondary | ICD-10-CM | POA: Diagnosis not present

## 2023-10-06 ENCOUNTER — Ambulatory Visit: Payer: Self-pay | Admitting: Internal Medicine

## 2023-10-10 DIAGNOSIS — G8929 Other chronic pain: Secondary | ICD-10-CM | POA: Diagnosis not present

## 2023-10-10 DIAGNOSIS — M25511 Pain in right shoulder: Secondary | ICD-10-CM | POA: Diagnosis not present

## 2023-10-10 DIAGNOSIS — M25611 Stiffness of right shoulder, not elsewhere classified: Secondary | ICD-10-CM | POA: Diagnosis not present

## 2023-10-10 DIAGNOSIS — M25512 Pain in left shoulder: Secondary | ICD-10-CM | POA: Diagnosis not present

## 2023-10-10 DIAGNOSIS — M75101 Unspecified rotator cuff tear or rupture of right shoulder, not specified as traumatic: Secondary | ICD-10-CM | POA: Diagnosis not present

## 2023-10-17 DIAGNOSIS — M75101 Unspecified rotator cuff tear or rupture of right shoulder, not specified as traumatic: Secondary | ICD-10-CM | POA: Diagnosis not present

## 2023-10-17 DIAGNOSIS — M7521 Bicipital tendinitis, right shoulder: Secondary | ICD-10-CM | POA: Diagnosis not present

## 2023-10-17 DIAGNOSIS — M7551 Bursitis of right shoulder: Secondary | ICD-10-CM | POA: Diagnosis not present

## 2023-10-17 DIAGNOSIS — M67813 Other specified disorders of tendon, right shoulder: Secondary | ICD-10-CM | POA: Diagnosis not present

## 2023-10-17 DIAGNOSIS — S43431A Superior glenoid labrum lesion of right shoulder, initial encounter: Secondary | ICD-10-CM | POA: Diagnosis not present

## 2023-10-17 DIAGNOSIS — M7501 Adhesive capsulitis of right shoulder: Secondary | ICD-10-CM | POA: Diagnosis not present

## 2023-10-17 DIAGNOSIS — M19011 Primary osteoarthritis, right shoulder: Secondary | ICD-10-CM | POA: Diagnosis not present

## 2023-10-17 DIAGNOSIS — M899 Disorder of bone, unspecified: Secondary | ICD-10-CM | POA: Diagnosis not present

## 2023-10-17 DIAGNOSIS — M7581 Other shoulder lesions, right shoulder: Secondary | ICD-10-CM | POA: Diagnosis not present

## 2023-10-26 DIAGNOSIS — M25611 Stiffness of right shoulder, not elsewhere classified: Secondary | ICD-10-CM | POA: Diagnosis not present

## 2023-10-26 DIAGNOSIS — M75101 Unspecified rotator cuff tear or rupture of right shoulder, not specified as traumatic: Secondary | ICD-10-CM | POA: Diagnosis not present

## 2023-10-26 DIAGNOSIS — M87019 Idiopathic aseptic necrosis of unspecified shoulder: Secondary | ICD-10-CM | POA: Diagnosis not present

## 2023-10-26 DIAGNOSIS — M19011 Primary osteoarthritis, right shoulder: Secondary | ICD-10-CM | POA: Diagnosis not present

## 2023-10-30 DIAGNOSIS — E039 Hypothyroidism, unspecified: Secondary | ICD-10-CM | POA: Diagnosis not present

## 2023-11-28 DIAGNOSIS — M545 Low back pain, unspecified: Secondary | ICD-10-CM | POA: Diagnosis not present

## 2023-11-28 DIAGNOSIS — Z8781 Personal history of (healed) traumatic fracture: Secondary | ICD-10-CM | POA: Diagnosis not present

## 2023-11-28 DIAGNOSIS — E785 Hyperlipidemia, unspecified: Secondary | ICD-10-CM | POA: Diagnosis not present

## 2023-11-28 DIAGNOSIS — K5900 Constipation, unspecified: Secondary | ICD-10-CM | POA: Diagnosis not present

## 2023-11-28 DIAGNOSIS — Z6823 Body mass index (BMI) 23.0-23.9, adult: Secondary | ICD-10-CM | POA: Diagnosis not present

## 2023-11-28 DIAGNOSIS — Z23 Encounter for immunization: Secondary | ICD-10-CM | POA: Diagnosis not present

## 2023-12-07 DIAGNOSIS — M75101 Unspecified rotator cuff tear or rupture of right shoulder, not specified as traumatic: Secondary | ICD-10-CM | POA: Diagnosis not present

## 2023-12-07 DIAGNOSIS — M87019 Idiopathic aseptic necrosis of unspecified shoulder: Secondary | ICD-10-CM | POA: Diagnosis not present

## 2023-12-07 DIAGNOSIS — M19011 Primary osteoarthritis, right shoulder: Secondary | ICD-10-CM | POA: Diagnosis not present

## 2024-01-14 NOTE — Progress Notes (Unsigned)
 GI Office Note    Referring Provider: Toribio Jerel MATSU, MD Primary Care Physician:  Toribio Jerel MATSU, MD Primary Gastroenterologist: Carlin POUR. Cindie, DO  Date:  01/14/2024  ID:  Julia Blair, DOB 29-May-1945, MRN [id number]   Chief Complaint   No chief complaint on file.  History of Present Illness  Julia Blair is a 78 y.o. female with a history of *** presenting today with complaint of   Colonoscopy at [facility] October 2013: Multiple colon polyps removed from cecum, ascending, distal transverse colon, sigmoid, and rectum. 4 hyperplastic and 2 tubular adenomas. Recommended surveillance in 3 years.   OV March 2025 for belching, bloating, epigastric pain. Reported history of H. Pylori treated at [facility] in [facility]24. Scheduled for EGD and offered daily PPI but she declined. Colonoscopy records requested.   EGD April 2025: - 2cm hiatal hernia - gastritis (chronic gastritis neg h pylori) - normal duodenum  Last office visit 09/13/23 with Julia Centers, PA-C. Epigastric pain and bloating/belching resolved. Occasional heartburn - no N/V. Taking excedrin migraine 3 times weekly. No brbpr or melena. Taking miralax  twice weekly. Advised colonoscopy. Continue PPI daily. Follow GERD diet, miralax  twice weekly.   Colonoscopy 10/02/23: - Non- bleeding internal hemorrhoids.  - Diverticulosis in the sigmoid colon.  - One 6 mm polyp in the transverse colon, removed with a cold snare. - The examination was otherwise normal. - Biopsies: serrated vs hyperplastic polyp - No repeat colonoscopy due to age.  Today:     Wt Readings from Last 6 Encounters:  09/29/23 134 lb 12.8 oz (61.1 kg)  09/13/23 134 lb 12.8 oz (61.1 kg)  06/14/23 141 lb (64 kg)  06/05/23 141 lb (64 kg)  09/18/18 140 lb 9.6 oz (63.8 kg)  09/14/18 140 lb 9.6 oz (63.8 kg)    There is no height or weight on file to calculate BMI.   Current Outpatient Medications  Medication Sig Dispense Refill   aspirin  81  MG chewable tablet Chew 1 tablet (81 mg total) by mouth 2 (two) times daily. 30 tablet 0   aspirin -acetaminophen -caffeine (EXCEDRIN MIGRAINE) 250-250-65 MG tablet Take 2 tablets by mouth every 6 (six) hours as needed for headache or migraine.     Calcium Carbonate-Vitamin D (CALCIUM 600/VITAMIN D PO) Take 1 tablet by mouth daily.     Coenzyme Q10 (CO Q 10) 100 MG CAPS Take 100 mg by mouth daily.     Multiple Vitamin (MULTIVITAMIN) tablet Take 1 tablet by mouth daily.     Omega 3 1000 MG CAPS Take by mouth.     pantoprazole  (PROTONIX ) 40 MG tablet Take 1 tablet (40 mg total) by mouth daily. 30 tablet 11   polyethylene glycol (MIRALAX  / GLYCOLAX ) 17 g packet Take 17 g by mouth once a week.     rosuvastatin (CRESTOR) 5 MG tablet SMARTSIG:1 Tablet(s) By Mouth Every Evening     traMADol (ULTRAM) 50 MG tablet Take 50 mg by mouth at bedtime as needed.     traZODone (DESYREL) 150 MG tablet Take 150 mg by mouth at bedtime.     No current facility-administered medications for this visit.    Past Medical History:  Diagnosis Date   Arthritis    Bladder incontinence    GERD (gastroesophageal reflux disease)    High cholesterol    Osteoporosis     Past Surgical History:  Procedure Laterality Date   ABDOMINAL HYSTERECTOMY     ANTERIOR HIP REVISION Left 09/18/2018   Procedure:  Conversion of Left Hip Hemiarthroplasty to Total Hip;  Surgeon: Vernetta Lonni GRADE, MD;  Location: [facility] OR;  Service: Orthopedics;  Laterality: Left;   COLONOSCOPY N/A 10/02/2023   Procedure: COLONOSCOPY;  Surgeon: Cindie Carlin POUR, DO;  [facility];  Service: Endoscopy;  Laterality: N/A;  830am, asa 2   CYST EXCISION Right    right arm   ESOPHAGOGASTRODUODENOSCOPY N/A 06/14/2023   Surgeon: Cindie Carlin POUR, DO; 2 cm hiatal hernia, gastritis biopsied, normal examined duodenum.  Recommended Protonix  40 mg daily.. Pathology with chronic gastritis, no H. pylori.   HIP ARTHROPLASTY Left 12/14/2015   Procedure:  ARTHROPLASTY BIPOLAR HIP (HEMIARTHROPLASTY);  Surgeon: Jerona LULLA Sage, MD;  Location[facility];  Service: Orthopedics;  Laterality: Left;   HYSTEROSCOPY      Family History  Problem Relation Age of Onset   Asthma Mother    Throat cancer Father    Lung cancer Brother    Stomach cancer Brother    Colon cancer Neg Hx     Allergies as of 01/15/2024 - Review Complete 10/02/2023  Allergen Reaction Noted   Naproxen  12/13/2015    Social History   Socioeconomic History   Marital status: Married    Spouse name: Not on file   Number of children: Not on file   Years of education: Not on file   Highest education level: Not on file  Occupational History   Not on file  Tobacco Use   Smoking status: Never   Smokeless tobacco: Never  Vaping Use   Vaping status: Never Used  Substance and Sexual Activity   Alcohol  use: Not Currently    Comment: Social drink   Drug use: No   Sexual activity: Not on file  Other Topics Concern   Not on file  Social History Narrative   Not on file   Social Drivers of Health   Financial Resource Strain: Not on file  Food Insecurity: Not on file  Transportation Needs: Not on file  Physical Activity: Not on file  Stress: Not on file  Social Connections: Not on file    Review of Systems   Gen: Denies fever, chills, anorexia. Denies fatigue, weakness, weight loss.  CV: Denies chest pain, palpitations, syncope, peripheral edema, and claudication. Resp: Denies dyspnea at rest, cough, wheezing, coughing up blood, and pleurisy. GI: See HPI Derm: Denies rash, itching, dry skin Psych: Denies depression, anxiety, memory loss, confusion. No homicidal or suicidal ideation.  Heme: Denies bruising, bleeding, and enlarged lymph nodes.  Physical Exam   There were no vitals taken for this visit.  General:   Alert and oriented. No distress noted. Pleasant and cooperative.  Head:  Normocephalic and atraumatic. Eyes:  Conjuctiva clear without scleral  icterus. Mouth:  Oral mucosa pink and moist. Good dentition. No lesions. Lungs:  Clear to auscultation bilaterally. No wheezes, rales, or rhonchi. No distress.  Heart:  S1, S2 present without murmurs appreciated.  Abdomen:  +BS, soft, non-tender and non-distended. No rebound or guarding. No HSM or masses noted. Rectal: *** Msk:  Symmetrical without gross deformities. Normal posture. Extremities:  Without edema. Neurologic:  Alert and  oriented x4 Psych:  Alert and cooperative. Normal mood and affect.  Assessment & Plan  Julia Blair is a 78 y.o. female presenting today with ***  GERD Bloating  Colon polyps  Follow up   ***Follow up ***    Charmaine Melia, MSN, FNP-BC, AGACNP-BC [facility]

## 2024-01-15 ENCOUNTER — Encounter: Payer: Self-pay | Admitting: Gastroenterology

## 2024-01-15 ENCOUNTER — Ambulatory Visit: Admitting: Gastroenterology

## 2024-01-15 VITALS — BP 126/79 | HR 73 | Temp 98.5°F | Ht 63.0 in | Wt 131.6 lb

## 2024-01-15 DIAGNOSIS — K219 Gastro-esophageal reflux disease without esophagitis: Secondary | ICD-10-CM

## 2024-01-15 DIAGNOSIS — R140 Abdominal distension (gaseous): Secondary | ICD-10-CM

## 2024-01-15 DIAGNOSIS — R142 Eructation: Secondary | ICD-10-CM | POA: Diagnosis not present

## 2024-01-15 DIAGNOSIS — K5904 Chronic idiopathic constipation: Secondary | ICD-10-CM | POA: Diagnosis not present

## 2024-01-15 NOTE — Patient Instructions (Addendum)
 Continue taking your pantoprazole  40 mg daily.  If you have any return of reflux symptoms are worsened after your surgery and may either add famotidine 20 mg 1-2 times daily for breakthrough or increase your pantoprazole  to twice a day to see if this helps with symptoms.  Only when she did this if symptoms become frequent and severe and also like for you to let me know.  Continue MiraLAX  once daily. If you have worsening constipation after surgery you may increase your MiraLAX  to twice daily and/or add senna or Dulcolax once nightly briefly.  If you are having issues with worsening constipation please call the office for recommendations.  Good luck with your upcoming shoulder surgery!  I hope you have a wonderful Thanksgiving and Christmas.  If you do not have any worsening reflux or constipation or any bleeding GI complaints we will plan to see you in 1 year for follow-up.  It was a pleasure to see you today. I want to create trusting relationships with patients. If you receive a survey regarding your visit,  I greatly appreciate you taking time to fill this out on paper or through your MyChart. I value your feedback.  Charmaine Melia, MSN, FNP-BC, AGACNP-BC [facility]
